# Patient Record
Sex: Female | Born: 1970 | Race: White | Hispanic: No | Smoking: Never smoker
Health system: Southern US, Community
[De-identification: ages and names within clinical notes are randomized; demographics above are authoritative.]

## PROBLEM LIST (undated history)

## (undated) DIAGNOSIS — N920 Excessive and frequent menstruation with regular cycle: Secondary | ICD-10-CM

## (undated) DIAGNOSIS — E669 Obesity, unspecified: Secondary | ICD-10-CM

## (undated) DIAGNOSIS — E785 Hyperlipidemia, unspecified: Secondary | ICD-10-CM

## (undated) HISTORY — DX: Obesity, unspecified: E66.9

## (undated) HISTORY — DX: Excessive and frequent menstruation with regular cycle: N92.0

## (undated) HISTORY — DX: Hyperlipidemia, unspecified: E78.5

---

## 1999-08-16 ENCOUNTER — Other Ambulatory Visit: Admission: RE | Admit: 1999-08-16 | Discharge: 1999-08-16 | Payer: Self-pay | Admitting: Gynecology

## 2000-07-31 ENCOUNTER — Other Ambulatory Visit: Admission: RE | Admit: 2000-07-31 | Discharge: 2000-07-31 | Payer: Self-pay | Admitting: *Deleted

## 2000-11-10 ENCOUNTER — Encounter: Admission: RE | Admit: 2000-11-10 | Discharge: 2001-02-08 | Payer: Self-pay | Admitting: Gynecology

## 2001-02-12 ENCOUNTER — Encounter (INDEPENDENT_AMBULATORY_CARE_PROVIDER_SITE_OTHER): Payer: Self-pay | Admitting: *Deleted

## 2001-02-12 ENCOUNTER — Inpatient Hospital Stay (HOSPITAL_COMMUNITY): Admission: AD | Admit: 2001-02-12 | Discharge: 2001-02-14 | Payer: Self-pay | Admitting: *Deleted

## 2001-03-23 ENCOUNTER — Other Ambulatory Visit: Admission: RE | Admit: 2001-03-23 | Discharge: 2001-03-23 | Payer: Self-pay | Admitting: Gynecology

## 2002-04-12 ENCOUNTER — Other Ambulatory Visit: Admission: RE | Admit: 2002-04-12 | Discharge: 2002-04-12 | Payer: Self-pay | Admitting: Gynecology

## 2005-12-01 ENCOUNTER — Other Ambulatory Visit: Admission: RE | Admit: 2005-12-01 | Discharge: 2005-12-01 | Payer: Self-pay | Admitting: Family Medicine

## 2008-02-01 ENCOUNTER — Other Ambulatory Visit: Admission: RE | Admit: 2008-02-01 | Discharge: 2008-02-01 | Payer: Self-pay | Admitting: Family Medicine

## 2010-02-14 ENCOUNTER — Other Ambulatory Visit: Admission: RE | Admit: 2010-02-14 | Discharge: 2010-02-14 | Payer: Self-pay | Admitting: Family Medicine

## 2011-04-11 NOTE — H&P (Signed)
[facility]  Patient:    Christy Kidd, Christy Kidd                      MRN: [id number] Adm. Date:  [id number] Attending:  Wetzel Bjornstad                         History and Physical  CHIEF COMPLAINT:              Labor.  HISTORY OF PRESENT ILLNESS:   A 40 year old G67, P45, female at [redacted] weeks gestation.  History of chronic hypertension with registration blood pressure 140/90, although noting most recent prenatal visits with blood pressures in the 110-120/70 range as well as diet-controlled gestational diabetes with well controlled glucose on diet alone, who presents to the office for routine visit and antepartum testing.  The patient notes she has been contracting since 6:00 this morning.  These seem to be progressively getting stronger and was found to be contracting every 3-5 minutes on external monitors.  She had a reactive fetal tracing.  The patients pelvic exam was 3-4 cm dilated, 80% effaced, -2 to -1 station, with her exam last week showing 2 cm dilated, long, and high. The patient is admitted at this time with early labor.  PRENATAL COURSE:              Complicated by chronic hypertension and gestational diabetes, as noted above.  The patient is beta strep negative.  PAST MEDICAL HISTORY:         None.  PAST SURGICAL HISTORY:        Elbow surgery.  ALLERGIES:                    None.  MEDICATIONS:                  Vitamins.  REVIEW OF SYSTEMS:            Noncontributory.  FAMILY HISTORY:               Noncontributory.  ADMISSION PHYSICAL EXAMINATION:  VITAL SIGNS:                  Afebrile, vital signs are stable.  HEENT:                        Normal.  LUNGS:                        Clear.  CARDIAC:                      Regular rate.  No murmur, rub or gallop.  ABDOMEN:                      Gravid, vertex fetus, reactive fetal tracing, contractions every 3-5 minutes.  PELVIC:                       3-4 cm, 80%, -1 to -2  station.  ASSESSMENT:                   A 40 year old G2, P39, female, history of chronic hypertension with normal blood pressures now, history of gestational diabetes with diet control.  Good glucose values, admitted in early labor at 3-4 cm dilatation, contracting every 3-5 minutes.  The patient is  beta strep negative, and she is admitted for labor and delivery. DD:  02/12/01 TD:  02/12/01 Job: [id number]

## 2011-04-11 NOTE — Discharge Summary (Signed)
[facility]  Patient:    Christy Kidd, Christy Kidd                      MRN: [id number] Adm. Date:  [id number] Disc. Date: [id number] Attending:  Wetzel Bjornstad Dictator:   Antony Contras, [facility]                           Discharge Summary  DISCHARGE DIAGNOSES:          1. Intrauterine pregnancy at term.                               2. History of chronic hypertension.                               3. Gestational diabetes, diet-controlled.  PROCEDURES:                   1. Normal spontaneous vaginal delivery of                                  viable infant over intact perineum.                               2. Reduction of nuchal cord.  HISTORY OF PRESENT ILLNESS:   The patient is a 40 year old gravida 2, para 1-0-0-1, with an LMP of May 15, 2000, [facility] of February 17, 2001.  Prenatal risk factors include a history of chronic hypertension and gestational diabetes controlled by diet.  PRENATAL LABORATORY DATA:     Blood type OB positive, antibody screen negative.  RPR, HBsAg, HIV nonreactive.  Rubella immune.  MSAFP normal.  GBS negative.  HOSPITAL COURSE:              The patient presented on February 12, 2001, in active labor.  Cervix on admission was 5-6 cm, completely effaced, -1 station. She progressed to complete dilatation.  Delivery was performed by Dr. Lily Peer.  She delivered an Apgars 8 and 39 female infant weighing 8 pounds over an intact perineum.  There was a nuchal cord x 1 which was manually reduced.  Placenta was intact.  Postpartum course:  The patient remained afebrile, had no difficulty voiding, was able to be discharged in satisfactory condition on her second postpartum day.  CBC:  Hematocrit 37.7, hemoglobin 13.6, platelets 259.  FOLLOW-UP:                    In six weeks.  MEDICATIONS:                  Continue with prenatal vitamins and iron. Motrin and Tylox for pain. DD:  03/01/01 TD:  03/02/01 Job: [id number]

## 2014-04-26 ENCOUNTER — Other Ambulatory Visit: Payer: Self-pay | Admitting: Family Medicine

## 2014-04-26 ENCOUNTER — Other Ambulatory Visit (HOSPITAL_COMMUNITY)
Admission: RE | Admit: 2014-04-26 | Discharge: 2014-04-26 | Disposition: A | Discharge status: Home / Self Care | Payer: BC Managed Care – PPO | Source: Ambulatory Visit | Attending: Family Medicine | Admitting: Family Medicine

## 2014-04-26 ENCOUNTER — Other Ambulatory Visit: Payer: Self-pay

## 2014-04-26 DIAGNOSIS — Z1151 Encounter for screening for human papillomavirus (HPV): Secondary | ICD-10-CM | POA: Insufficient documentation

## 2014-04-26 DIAGNOSIS — Z124 Encounter for screening for malignant neoplasm of cervix: Secondary | ICD-10-CM | POA: Insufficient documentation

## 2014-04-26 DIAGNOSIS — Z1231 Encounter for screening mammogram for malignant neoplasm of breast: Secondary | ICD-10-CM

## 2014-04-28 LAB — CYTOLOGY - PAP

## 2014-05-05 ENCOUNTER — Ambulatory Visit
Admission: RE | Admit: 2014-05-05 | Discharge: 2014-05-05 | Disposition: A | Discharge status: Home / Self Care | Payer: BC Managed Care – PPO | Source: Ambulatory Visit

## 2014-05-05 ENCOUNTER — Encounter (INDEPENDENT_AMBULATORY_CARE_PROVIDER_SITE_OTHER): Payer: Self-pay

## 2014-05-05 DIAGNOSIS — Z1231 Encounter for screening mammogram for malignant neoplasm of breast: Secondary | ICD-10-CM

## 2014-05-24 ENCOUNTER — Other Ambulatory Visit: Payer: Self-pay | Admitting: Family Medicine

## 2014-05-24 DIAGNOSIS — R928 Other abnormal and inconclusive findings on diagnostic imaging of breast: Secondary | ICD-10-CM

## 2014-06-05 ENCOUNTER — Encounter (INDEPENDENT_AMBULATORY_CARE_PROVIDER_SITE_OTHER): Payer: Self-pay

## 2014-06-05 ENCOUNTER — Ambulatory Visit
Admission: RE | Admit: 2014-06-05 | Discharge: 2014-06-05 | Disposition: A | Discharge status: Home / Self Care | Payer: BC Managed Care – PPO | Source: Ambulatory Visit | Attending: Family Medicine | Admitting: Family Medicine

## 2014-06-05 DIAGNOSIS — R928 Other abnormal and inconclusive findings on diagnostic imaging of breast: Secondary | ICD-10-CM

## 2016-07-18 ENCOUNTER — Other Ambulatory Visit: Payer: Self-pay | Admitting: Family Medicine

## 2016-07-18 DIAGNOSIS — Z1231 Encounter for screening mammogram for malignant neoplasm of breast: Secondary | ICD-10-CM

## 2016-07-23 ENCOUNTER — Ambulatory Visit
Admission: RE | Admit: 2016-07-23 | Discharge: 2016-07-23 | Disposition: A | Discharge status: Home / Self Care | Payer: BC Managed Care – PPO | Source: Ambulatory Visit | Attending: Family Medicine | Admitting: Family Medicine

## 2016-07-23 DIAGNOSIS — Z1231 Encounter for screening mammogram for malignant neoplasm of breast: Secondary | ICD-10-CM

## 2016-07-29 ENCOUNTER — Other Ambulatory Visit: Payer: Self-pay | Admitting: Internal Medicine

## 2016-07-29 ENCOUNTER — Other Ambulatory Visit: Payer: Self-pay | Admitting: Family Medicine

## 2016-07-29 DIAGNOSIS — R928 Other abnormal and inconclusive findings on diagnostic imaging of breast: Secondary | ICD-10-CM

## 2016-08-08 ENCOUNTER — Ambulatory Visit
Admission: RE | Admit: 2016-08-08 | Discharge: 2016-08-08 | Disposition: A | Discharge status: Home / Self Care | Payer: BC Managed Care – PPO | Source: Ambulatory Visit | Attending: Family Medicine | Admitting: Family Medicine

## 2016-08-08 DIAGNOSIS — R928 Other abnormal and inconclusive findings on diagnostic imaging of breast: Secondary | ICD-10-CM

## 2016-12-31 ENCOUNTER — Other Ambulatory Visit: Payer: Self-pay | Admitting: Family Medicine

## 2016-12-31 DIAGNOSIS — R921 Mammographic calcification found on diagnostic imaging of breast: Secondary | ICD-10-CM

## 2017-01-28 ENCOUNTER — Ambulatory Visit
Admission: RE | Admit: 2017-01-28 | Discharge: 2017-01-28 | Disposition: A | Discharge status: Home / Self Care | Payer: BC Managed Care – PPO | Source: Ambulatory Visit | Attending: Family Medicine | Admitting: Family Medicine

## 2017-01-28 DIAGNOSIS — R921 Mammographic calcification found on diagnostic imaging of breast: Secondary | ICD-10-CM

## 2017-06-25 ENCOUNTER — Other Ambulatory Visit: Payer: Self-pay | Admitting: Family Medicine

## 2017-06-25 DIAGNOSIS — R921 Mammographic calcification found on diagnostic imaging of breast: Secondary | ICD-10-CM

## 2017-08-14 ENCOUNTER — Other Ambulatory Visit: Payer: Self-pay | Admitting: Family Medicine

## 2017-08-14 ENCOUNTER — Ambulatory Visit
Admission: RE | Admit: 2017-08-14 | Discharge: 2017-08-14 | Disposition: A | Discharge status: Home / Self Care | Payer: BC Managed Care – PPO | Source: Ambulatory Visit | Attending: Family Medicine | Admitting: Family Medicine

## 2017-08-14 DIAGNOSIS — R921 Mammographic calcification found on diagnostic imaging of breast: Secondary | ICD-10-CM

## 2019-05-20 ENCOUNTER — Other Ambulatory Visit: Payer: Self-pay | Admitting: Family Medicine

## 2019-05-20 ENCOUNTER — Other Ambulatory Visit (HOSPITAL_COMMUNITY)
Admission: RE | Admit: 2019-05-20 | Discharge: 2019-05-20 | Disposition: A | Discharge status: Home / Self Care | Payer: BC Managed Care – PPO | Source: Ambulatory Visit | Attending: Family Medicine | Admitting: Family Medicine

## 2019-05-20 DIAGNOSIS — Z124 Encounter for screening for malignant neoplasm of cervix: Secondary | ICD-10-CM | POA: Insufficient documentation

## 2019-05-25 LAB — CYTOLOGY - PAP
Diagnosis: NEGATIVE
HPV: NOT DETECTED

## 2020-02-03 ENCOUNTER — Ambulatory Visit: Payer: BC Managed Care – PPO | Attending: Internal Medicine

## 2020-02-03 DIAGNOSIS — Z23 Encounter for immunization: Secondary | ICD-10-CM

## 2020-02-03 NOTE — Progress Notes (Signed)
   Covid-19 Vaccination Clinic  Name:  Christy Kidd    MRN: [id number] DOB: December 01, 1970  02/03/2020  Christy Kidd was observed post Covid-19 immunization for 15 minutes without incident. She was provided with Vaccine Information Sheet and instruction to access the V-Safe system.   Christy Kidd was instructed to call 911 with any severe reactions post vaccine: Marland Kitchen Difficulty breathing  . Swelling of face and throat  . A fast heartbeat  . A bad rash all over body  . Dizziness and weakness   Immunizations Administered    Name Date Dose VIS Date Route   Pfizer COVID-19 Vaccine 02/03/2020 12:57 PM 0.3 mL 11/04/2019 Intramuscular   Manufacturer: [employer], [employer]   Lot: [id number]   NDC: [address]-3

## 2020-02-29 ENCOUNTER — Ambulatory Visit: Payer: BC Managed Care – PPO | Attending: Internal Medicine

## 2020-02-29 DIAGNOSIS — Z23 Encounter for immunization: Secondary | ICD-10-CM

## 2020-02-29 NOTE — Progress Notes (Signed)
   Covid-19 Vaccination Clinic  Name:  PABLO MATHURIN    MRN: [id number] DOB: 1971-02-19  02/29/2020  Ms. Wardrop was observed post Covid-19 immunization for 15 minutes without incident. She was provided with Vaccine Information Sheet and instruction to access the V-Safe system.   Ms. Salmons was instructed to call 911 with any severe reactions post vaccine: Marland Kitchen Difficulty breathing  . Swelling of face and throat  . A fast heartbeat  . A bad rash all over body  . Dizziness and weakness   Immunizations Administered    Name Date Dose VIS Date Route   Pfizer COVID-19 Vaccine 02/29/2020  8:25 AM 0.3 mL 11/04/2019 Intramuscular   Manufacturer: [employer], [employer]   Lot: [id number]   NDC: [address]-7

## 2020-04-30 ENCOUNTER — Other Ambulatory Visit: Payer: Self-pay | Admitting: Family Medicine

## 2020-04-30 DIAGNOSIS — R921 Mammographic calcification found on diagnostic imaging of breast: Secondary | ICD-10-CM

## 2020-05-09 ENCOUNTER — Ambulatory Visit
Admission: RE | Admit: 2020-05-09 | Discharge: 2020-05-09 | Disposition: A | Discharge status: Home / Self Care | Payer: BC Managed Care – PPO | Source: Ambulatory Visit | Attending: Family Medicine | Admitting: Family Medicine

## 2020-05-09 ENCOUNTER — Other Ambulatory Visit: Payer: Self-pay

## 2020-05-09 DIAGNOSIS — R921 Mammographic calcification found on diagnostic imaging of breast: Secondary | ICD-10-CM

## 2020-07-03 ENCOUNTER — Other Ambulatory Visit: Payer: Self-pay

## 2020-07-03 ENCOUNTER — Emergency Department (HOSPITAL_BASED_OUTPATIENT_CLINIC_OR_DEPARTMENT_OTHER)
Admission: EM | Admit: 2020-07-03 | Discharge: 2020-07-03 | Discharge status: Against Medical Advice | Payer: BC Managed Care – PPO

## 2020-07-03 NOTE — ED Notes (Signed)
Pt chose to go to [facility]

## 2020-07-04 ENCOUNTER — Ambulatory Visit (HOSPITAL_COMMUNITY)
Admission: RE | Admit: 2020-07-04 | Discharge: 2020-07-04 | Disposition: A | Discharge status: Home / Self Care | Payer: BC Managed Care – PPO | Source: Ambulatory Visit | Attending: Vascular Surgery | Admitting: Vascular Surgery

## 2020-07-04 ENCOUNTER — Other Ambulatory Visit (HOSPITAL_COMMUNITY): Payer: Self-pay | Admitting: Family Medicine

## 2020-07-04 DIAGNOSIS — R7989 Other specified abnormal findings of blood chemistry: Secondary | ICD-10-CM | POA: Diagnosis not present

## 2020-07-04 NOTE — Progress Notes (Signed)
Left lower venous duplex completed.  Called results to [facility] at Dr. Rondel Baton office. Patient can be instructed to leave and Melissa with call patient with instructions after speaking to MD.  Jeb Levering, BS, RDMS, RVT

## 2020-07-13 ENCOUNTER — Telehealth: Payer: Self-pay | Admitting: Hematology and Oncology

## 2020-07-13 NOTE — Telephone Encounter (Signed)
Received a new hem referral from Sigmund Hazel at [place] for management of dvt. Ms. Christy Kidd has been cld and scheduled to see Dr. Leonides Schanz on 9/10 at 2pm w/labs at 3pm. Pt aware to arrive 15 minutes early. Letter mailed.

## 2020-08-03 ENCOUNTER — Other Ambulatory Visit: Payer: Self-pay

## 2020-08-03 ENCOUNTER — Inpatient Hospital Stay: Payer: BC Managed Care – PPO

## 2020-08-03 ENCOUNTER — Inpatient Hospital Stay: Payer: BC Managed Care – PPO | Attending: Hematology and Oncology | Admitting: Hematology and Oncology

## 2020-08-03 ENCOUNTER — Encounter: Payer: Self-pay | Admitting: Hematology and Oncology

## 2020-08-03 VITALS — BP 160/81 | HR 117 | Temp 99.6°F | Resp 18 | Ht 64.0 in | Wt 220.7 lb

## 2020-08-03 DIAGNOSIS — E669 Obesity, unspecified: Secondary | ICD-10-CM | POA: Diagnosis not present

## 2020-08-03 DIAGNOSIS — Z6837 Body mass index (BMI) 37.0-37.9, adult: Secondary | ICD-10-CM | POA: Diagnosis not present

## 2020-08-03 DIAGNOSIS — N920 Excessive and frequent menstruation with regular cycle: Secondary | ICD-10-CM | POA: Diagnosis not present

## 2020-08-03 DIAGNOSIS — Z7901 Long term (current) use of anticoagulants: Secondary | ICD-10-CM | POA: Diagnosis not present

## 2020-08-03 DIAGNOSIS — Z79899 Other long term (current) drug therapy: Secondary | ICD-10-CM | POA: Insufficient documentation

## 2020-08-03 DIAGNOSIS — I82452 Acute embolism and thrombosis of left peroneal vein: Secondary | ICD-10-CM

## 2020-08-03 DIAGNOSIS — E785 Hyperlipidemia, unspecified: Secondary | ICD-10-CM | POA: Diagnosis not present

## 2020-08-03 DIAGNOSIS — I82442 Acute embolism and thrombosis of left tibial vein: Secondary | ICD-10-CM | POA: Insufficient documentation

## 2020-08-03 LAB — CMP (CANCER CENTER ONLY)
ALT: 14 U/L (ref 0–44)
AST: 16 U/L (ref 15–41)
Albumin: 4.1 g/dL (ref 3.5–5.0)
Alkaline Phosphatase: 35 U/L — ABNORMAL LOW (ref 38–126)
Anion gap: 10 (ref 5–15)
BUN: 14 mg/dL (ref 6–20)
CO2: 24 mmol/L (ref 22–32)
Calcium: 10.1 mg/dL (ref 8.9–10.3)
Chloride: 107 mmol/L (ref 98–111)
Creatinine: 0.75 mg/dL (ref 0.44–1.00)
GFR, Est AFR Am: >60 mL/min (ref >60)
GFR, Estimated: >60 mL/min (ref >60)
Glucose, Bld: 83 mg/dL (ref 70–99)
Potassium: 4.2 mmol/L (ref 3.5–5.1)
Sodium: 141 mmol/L (ref 135–145)
Total Bilirubin: 0.2 mg/dL — ABNORMAL LOW (ref 0.3–1.2)
Total Protein: 7.6 g/dL (ref 6.5–8.1)

## 2020-08-03 LAB — CBC WITH DIFFERENTIAL (CANCER CENTER ONLY)
Abs Immature Granulocytes: 0.03 10*3/uL (ref 0.00–0.07)
Basophils Absolute: 0 10*3/uL (ref 0.0–0.1)
Basophils Relative: 1 %
Eosinophils Absolute: 0.2 10*3/uL (ref 0.0–0.5)
Eosinophils Relative: 3 %
HCT: 34.2 % — ABNORMAL LOW (ref 36.0–46.0)
Hemoglobin: 11.2 g/dL — ABNORMAL LOW (ref 12.0–15.0)
Immature Granulocytes: 1 %
Lymphocytes Relative: 32 %
Lymphs Abs: 2 10*3/uL (ref 0.7–4.0)
MCH: 30.3 pg (ref 26.0–34.0)
MCHC: 32.7 g/dL (ref 30.0–36.0)
MCV: 92.4 fL (ref 80.0–100.0)
Monocytes Absolute: 0.6 10*3/uL (ref 0.1–1.0)
Monocytes Relative: 10 %
Neutro Abs: 3.4 10*3/uL (ref 1.7–7.7)
Neutrophils Relative %: 53 %
Platelet Count: 532 10*3/uL — ABNORMAL HIGH (ref 150–400)
RBC: 3.7 MIL/uL — ABNORMAL LOW (ref 3.87–5.11)
RDW: 13.1 % (ref 11.5–15.5)
WBC Count: 6.2 10*3/uL (ref 4.0–10.5)
nRBC: 0 % (ref 0.0–0.2)

## 2020-08-03 NOTE — Progress Notes (Signed)
[facility] [phone]   [phone]  INITIAL CONSULT NOTE  Patient Care Team: Sigmund Hazel, MD as PCP - General (Family Medicine)  Hematological/Oncological History #Provoked Left Lower Extremity DVT  07/04/2020: [place] LLE showed findings consistent with acute deep vein thrombosis involving the left posterior tibial veins, and left peroneal veins. Started on Xarelto 15 mg PO BID x 21 days, transition to Xarelto 20mg  PO daily. D-dimer 1.49 mg/L 08/03/2020: establish care with Dr. 10/03/2020   CHIEF COMPLAINTS/PURPOSE OF CONSULTATION:  " Left Lower Extremity DVT "  HISTORY OF PRESENTING ILLNESS:  Christy Kidd 49 y.o. female with medical history significant for obesity (BMI 37), HLD, and menorrhagia who presents for evaluation of a LLE DVT.   On review of the previous records Christy Kidd presented for a ultrasound of her left lower extremity when she developed pain and swelling following a prolonged period of immobility due to hornet stings.  Ultrasound showed an acute deep vein thrombosis involving the left posterior tibial veins and left peroneal veins.  She was started on Xarelto 50 mg p.o. twice daily x21 days with a plan to transition to Xarelto 20 mg.  Her D-dimer at that time was 1.49 mg/L.  Due to concern for this new diagnosis of DVT the patient was referred to hematology for further evaluation and management.  On exam today Christy Kidd notes that her story begins when she was attacked by a group of hornets while mowing her lawn.  She notes that she got stings on her inner thighs bilaterally.  Due to the pain and swelling that she developed in her thighs she had a.  Of several days of prolonged immobility where she reports that she sat for approximately 20 hours/day with her legs separated from each other.  She notes that when the rash began to resolve from the hornet stings she noticed that her left lower extremity was swelling, had erythema, and  continued to be painful.  When this persisted she sought medical attention.  She went to numerous emergency departments but was told that the weight would be all the way until the next morning.  She eventually received an outpatient ultrasound which showed her DVTs.  On further discussion she notes that she has a history of menorrhagia and is currently still having menstrual bleeding.  She reports that she does take an estrogen-based oral contraceptive pill for this purpose.  She is tolerating the Xarelto medication she was prescribed well without any issues of bleeding, bruising, or dark stools.  She has discontinued the oral contraceptive pill since her diagnosis.  Otherwise denies having any issues with chest pain, shortness of breath, fevers, chills, sweats, nausea, vomiting or diarrhea.  A full 10 point ROS is listed below.  MEDICAL HISTORY:  Past Medical History:  Diagnosis Date  . HLD (hyperlipidemia)   . Menorrhagia   . Obesity     SURGICAL HISTORY: History reviewed. No pertinent surgical history.  SOCIAL HISTORY: Social History   Socioeconomic History  . Marital status: Divorced    Spouse name: Not on file  . Number of children: 2  . Years of education: Not on file  . Highest education level: Not on file  Occupational History  . Not on file  Tobacco Use  . Smoking status: Never Smoker  . Smokeless tobacco: Never Used  Substance and Sexual Activity  . Alcohol use: Not Currently  . Drug use: Not on file  . Sexual activity: Not on file  Other Topics Concern  . Not on file  Social History Narrative  . Not on file   Social Determinants of Health   Financial Resource Strain:   . Difficulty of Paying Living Expenses: Not on file  Food Insecurity:   . Worried About Programme researcher, broadcasting/film/video in the Last Year: Not on file  . Ran Out of Food in the Last Year: Not on file  Transportation Needs:   . Lack of Transportation (Medical): Not on file  . Lack of Transportation  (Non-Medical): Not on file  Physical Activity:   . Days of Exercise per Week: Not on file  . Minutes of Exercise per Session: Not on file  Stress:   . Feeling of Stress : Not on file  Social Connections:   . Frequency of Communication with Friends and Family: Not on file  . Frequency of Social Gatherings with Friends and Family: Not on file  . Attends Religious Services: Not on file  . Active Member of Clubs or Organizations: Not on file  . Attends Banker Meetings: Not on file  . Marital Status: Not on file  Intimate Partner Violence:   . Fear of Current or Ex-Partner: Not on file  . Emotionally Abused: Not on file  . Physically Abused: Not on file  . Sexually Abused: Not on file    FAMILY HISTORY: Family History  Problem Relation Age of Onset  . Leukemia Mother   . Healthy Father   . Varicose Veins Paternal Grandmother     ALLERGIES:  has No Known Allergies.  MEDICATIONS:  Current Outpatient Medications  Medication Sig Dispense Refill  . B Complex-C (B-COMPLEX WITH VITAMIN C) tablet     . Calcium-Vitamin D-Vitamin K (VIACTIV CALCIUM PLUS D) 650-12.5-40 MG-MCG-MCG CHEW     . ergocalciferol (VITAMIN D2) 1.25 MG (50000 UT) capsule     . Ferrous Sulfate (PX IRON) 27 MG TABS     . Fluticasone Propionate (FLONASE ALLERGY RELIEF NA)     . Loratadine 10 MG CAPS     . Multiple Vitamin (DAILY VITAMIN FORMULA PO)     . atorvastatin (LIPITOR) 10 MG tablet Take 10 mg by mouth daily.    . fenofibrate micronized (LOFIBRA) 200 MG capsule Take 200 mg by mouth daily.    Marland Kitchen spironolactone (ALDACTONE) 50 MG tablet Take 50 mg by mouth daily.    Carlena Hurl 20 MG TABS tablet Take 20 mg by mouth at bedtime.     No current facility-administered medications for this visit.    REVIEW OF SYSTEMS:   Constitutional: ( - ) fevers, ( - )  chills , ( - ) night sweats Eyes: ( - ) blurriness of vision, ( - ) double vision, ( - ) watery eyes Ears, nose, mouth, throat, and face: ( - )  mucositis, ( - ) sore throat Respiratory: ( - ) cough, ( - ) dyspnea, ( - ) wheezes Cardiovascular: ( - ) palpitation, ( - ) chest discomfort, ( - ) lower extremity swelling Gastrointestinal:  ( - ) nausea, ( - ) heartburn, ( - ) change in bowel habits Skin: ( - ) abnormal skin rashes Lymphatics: ( - ) new lymphadenopathy, ( - ) easy bruising Neurological: ( - ) numbness, ( - ) tingling, ( - ) new weaknesses Behavioral/Psych: ( - ) mood change, ( - ) new changes  All other systems were reviewed with the patient and are negative.  PHYSICAL EXAMINATION: ECOG PERFORMANCE STATUS: 1 -  Symptomatic but completely ambulatory  Vitals:   08/03/20 1352  BP: (!) 160/81  Pulse: (!) 117  Resp: 18  Temp: 99.6 F (37.6 C)  SpO2: 98%   Filed Weights   08/03/20 1352  Weight: 220 lb 11.2 oz (100.1 kg)    GENERAL: well appearing middle aged Caucasian female in NAD  SKIN: skin color, texture, turgor are normal, no rashes or significant lesions EYES: conjunctiva are pink and non-injected, sclera clear LUNGS: clear to auscultation and percussion with normal breathing effort HEART: regular rate & rhythm and no murmurs and no lower extremity edema Musculoskeletal: no cyanosis of digits and no clubbing  PSYCH: alert & oriented x 3, fluent speech NEURO: no focal motor/sensory deficits  LABORATORY DATA:  I have reviewed the data as listed CBC Latest Ref Rng & Units 08/03/2020  WBC 4.0 - 10.5 K/uL 6.2  Hemoglobin 12.0 - 15.0 g/dL 11.2(L)  Hematocrit 36 - 46 % 34.2(L)  Platelets 150 - 400 K/uL 532(H)    CMP Latest Ref Rng & Units 08/03/2020  Glucose 70 - 99 mg/dL 83  BUN 6 - 20 mg/dL 14  Creatinine 1.320.44 - 4.401.00 mg/dL 1.020.75  Sodium 725135 - 366145 mmol/L 141  Potassium 3.5 - 5.1 mmol/L 4.2  Chloride 98 - 111 mmol/L 107  CO2 22 - 32 mmol/L 24  Calcium 8.9 - 10.3 mg/dL 44.010.1  Total Protein 6.5 - 8.1 g/dL 7.6  Total Bilirubin 0.3 - 1.2 mg/dL 3.4(V0.2(L)  Alkaline Phos 38 - 126 U/L 35(L)  AST 15 - 41 U/L 16   ALT 0 - 44 U/L 14    RADIOGRAPHIC STUDIES: I have personally reviewed the radiological images as listed and agreed with the findings in the report. No results found.  ASSESSMENT & PLAN Christy Kidd 49 y.o. female with medical history significant for obesity (BMI 37), HLD, and menorrhagia who presents for evaluation of a LLE DVT.  To review the labs, review the records, discussion with the patient the findings are most consistent with a provoked left lower extremity DVT due to prolonged immobility following hornet stings and estrogen-based oral contraceptive pills.  As a result I would recommend anticoagulation for duration of 3 to 6 months.  The termination between 3 to 6 months is based upon symptoms at 5166-month mark.  As such we will have the patient return in approximately 2 months time in order to further evaluate and determine if extended anticoagulation is required.  In the interim I would recommend consideration of transition to a progestin based oral contraceptive as this would reduce her risk in the future compared to estrogen-based oral contraceptives.  # Provoked Left Lower Extremity DVT  -- given the patient's account of her DVT the findings are most consistent with a provoked DVT due to immobility and estrogen based OCPs --recommend continuation of Xarelto 20mg  for 3-6 months. In order to determine duration, will have the patient return in approximately 2 months to see if an additional 3 months is required --strict return precautions for chest pain, shortness of breath, or new/worsening leg pain/swelling --RTC in 2 months time.   #Menorrhagia --currently under the management of Ob/GYN --Recommend progestin based birth control, if feasible -- caution with excessive bleeding on Xarelto.   Orders Placed This Encounter  Procedures  . CBC with Differential (Cancer Center Only)    Standing Status:   Future    Number of Occurrences:   1    Standing Expiration Date:   08/03/2021  .  CMP (Cancer Center only)    Standing Status:   Future    Number of Occurrences:   1    Standing Expiration Date:   08/03/2021   All questions were answered. The patient knows to call the clinic with any problems, questions or concerns.  A total of more than 60 minutes were spent on this encounter and over half of that time was spent on counseling and coordination of care as outlined above.   Ulysees Barns, MD Department of Hematology/Oncology [facility] Phone: [phone] Pager: [phone] Email: [email or web address]  08/04/2020 12:13 PM

## 2020-08-04 ENCOUNTER — Encounter: Payer: Self-pay | Admitting: Hematology and Oncology

## 2020-08-06 ENCOUNTER — Telehealth: Payer: Self-pay | Admitting: Hematology and Oncology

## 2020-08-06 NOTE — Telephone Encounter (Signed)
Scheduled per los. Called and spoke with patient. Confirmed appt 

## 2020-08-07 ENCOUNTER — Telehealth: Payer: Self-pay | Admitting: *Deleted

## 2020-08-07 NOTE — Telephone Encounter (Signed)
-----   Message from Jaci Standard, MD sent at 08/06/2020  9:53 AM EDT ----- Please let Christy Kidd know that her labs look good overall. She has a mild anemia and increased platelet count consistent with iron deficiency anemia. She has been prescribed PO iron therapy. Please assure she is taking this. We will see her back in about 2 months time.  ----- Message ----- From: Leory Plowman, [facility] [place] Sent: 08/03/2020   3:00 PM EDT To: Jaci Standard, MD

## 2020-08-07 NOTE — Telephone Encounter (Signed)
TCT patient regarding lab results from 08/03/20. No answer but was able to leave detailed message on her identified vm . Advised that her labs are consistent with iron deficiency anemia. Advised to continue her oral iron supplements. Advised of her f/u appt on 10/05/20. Advised to call back with any questions or concerns to [phone].

## 2020-10-05 ENCOUNTER — Other Ambulatory Visit: Payer: Self-pay | Admitting: Hematology and Oncology

## 2020-10-05 ENCOUNTER — Inpatient Hospital Stay: Payer: BC Managed Care – PPO | Attending: Hematology and Oncology | Admitting: Hematology and Oncology

## 2020-10-05 ENCOUNTER — Other Ambulatory Visit: Payer: Self-pay

## 2020-10-05 ENCOUNTER — Inpatient Hospital Stay: Payer: BC Managed Care – PPO

## 2020-10-05 VITALS — BP 168/80 | HR 102 | Temp 99.2°F | Resp 18 | Ht 64.0 in | Wt 222.3 lb

## 2020-10-05 DIAGNOSIS — D649 Anemia, unspecified: Secondary | ICD-10-CM | POA: Insufficient documentation

## 2020-10-05 DIAGNOSIS — I82452 Acute embolism and thrombosis of left peroneal vein: Secondary | ICD-10-CM

## 2020-10-05 DIAGNOSIS — D75839 Thrombocytosis, unspecified: Secondary | ICD-10-CM | POA: Diagnosis not present

## 2020-10-05 DIAGNOSIS — E785 Hyperlipidemia, unspecified: Secondary | ICD-10-CM | POA: Insufficient documentation

## 2020-10-05 DIAGNOSIS — Z79899 Other long term (current) drug therapy: Secondary | ICD-10-CM | POA: Diagnosis not present

## 2020-10-05 DIAGNOSIS — I82402 Acute embolism and thrombosis of unspecified deep veins of left lower extremity: Secondary | ICD-10-CM | POA: Diagnosis not present

## 2020-10-05 DIAGNOSIS — Z7901 Long term (current) use of anticoagulants: Secondary | ICD-10-CM | POA: Insufficient documentation

## 2020-10-05 DIAGNOSIS — I82442 Acute embolism and thrombosis of left tibial vein: Secondary | ICD-10-CM | POA: Diagnosis present

## 2020-10-05 LAB — CBC WITH DIFFERENTIAL (CANCER CENTER ONLY)
Abs Immature Granulocytes: 0.01 10*3/uL (ref 0.00–0.07)
Basophils Absolute: 0 10*3/uL (ref 0.0–0.1)
Basophils Relative: 1 %
Eosinophils Absolute: 0.2 10*3/uL (ref 0.0–0.5)
Eosinophils Relative: 3 %
HCT: 33.8 % — ABNORMAL LOW (ref 36.0–46.0)
Hemoglobin: 11.2 g/dL — ABNORMAL LOW (ref 12.0–15.0)
Immature Granulocytes: 0 %
Lymphocytes Relative: 38 %
Lymphs Abs: 2.1 10*3/uL (ref 0.7–4.0)
MCH: 30.4 pg (ref 26.0–34.0)
MCHC: 33.1 g/dL (ref 30.0–36.0)
MCV: 91.6 fL (ref 80.0–100.0)
Monocytes Absolute: 0.6 10*3/uL (ref 0.1–1.0)
Monocytes Relative: 11 %
Neutro Abs: 2.6 10*3/uL (ref 1.7–7.7)
Neutrophils Relative %: 47 %
Platelet Count: 431 10*3/uL — ABNORMAL HIGH (ref 150–400)
RBC: 3.69 MIL/uL — ABNORMAL LOW (ref 3.87–5.11)
RDW: 12.8 % (ref 11.5–15.5)
WBC Count: 5.5 10*3/uL (ref 4.0–10.5)
nRBC: 0 % (ref 0.0–0.2)

## 2020-10-05 LAB — CMP (CANCER CENTER ONLY)
ALT: 25 U/L (ref 0–44)
AST: 22 U/L (ref 15–41)
Albumin: 4 g/dL (ref 3.5–5.0)
Alkaline Phosphatase: 32 U/L — ABNORMAL LOW (ref 38–126)
Anion gap: 8 (ref 5–15)
BUN: 16 mg/dL (ref 6–20)
CO2: 28 mmol/L (ref 22–32)
Calcium: 9.4 mg/dL (ref 8.9–10.3)
Chloride: 106 mmol/L (ref 98–111)
Creatinine: 0.86 mg/dL (ref 0.44–1.00)
GFR, Estimated: >60 mL/min (ref >60)
Glucose, Bld: 128 mg/dL — ABNORMAL HIGH (ref 70–99)
Potassium: 4.2 mmol/L (ref 3.5–5.1)
Sodium: 142 mmol/L (ref 135–145)
Total Bilirubin: 0.3 mg/dL (ref 0.3–1.2)
Total Protein: 7.1 g/dL (ref 6.5–8.1)

## 2020-10-06 ENCOUNTER — Encounter: Payer: Self-pay | Admitting: Hematology and Oncology

## 2020-10-06 NOTE — Progress Notes (Signed)
[facility] [phone]   [phone]  PROGRESS NOTE  Patient Care Team: Sigmund Hazel, MD as PCP - General (Family Medicine)  Hematological/Oncological History #Provoked Left Lower Extremity DVT  1) 07/04/2020: [place] LLE showed findings consistent with acute deep vein thrombosis involving the left posterior tibial veins, and left peroneal veins. Started on Xarelto 15 mg PO BID x 21 days, transition to Xarelto 20mg  PO daily. D-dimer 1.49 mg/L 2) 08/03/2020: establish care with Dr. 10/03/2020  3) 10/05/2020: resolution of symptoms after 3 months of anticoagulation. OK to d/c anticoagulation therapy.  Interval History:  Christy Kidd 49 y.o. female with medical history significant for provoked LLE DVT who presents for a follow up visit. The patient's last visit was on 08/03/2020. In the interim since the last visit she has been compliant with her treatment.  On exam today Christy Kidd notes he is not currently having any symptoms in her left lower extremity.  She is not having any swelling, erythema, or pain in her leg.  She notes that she is tolerating Xarelto therapy well without any bleeding, bruising, or dark stools.  She reports that she would like to discontinue anticoagulation therapy if it is feasible at this time.  She does think that her menstrual cycles have become heavier since she was taking Xarelto therapy and reports that she is taking over-the-counter ferrous sulfate medication from [employer].  She notes that she typically always has low energy levels and that she has not experienced any major drop in those levels recently.  Otherwise she denies having issues with fevers, chills, sweats, nausea, vomiting or diarrhea.  Full 10 point ROS is listed below.  MEDICAL HISTORY:  Past Medical History:  Diagnosis Date  . HLD (hyperlipidemia)   . Menorrhagia   . Obesity     SURGICAL HISTORY: No past surgical history on file.  SOCIAL HISTORY: Social History    Socioeconomic History  . Marital status: Divorced    Spouse name: Not on file  . Number of children: 2  . Years of education: Not on file  . Highest education level: Not on file  Occupational History  . Not on file  Tobacco Use  . Smoking status: Never Smoker  . Smokeless tobacco: Never Used  Substance and Sexual Activity  . Alcohol use: Not Currently  . Drug use: Not on file  . Sexual activity: Not on file  Other Topics Concern  . Not on file  Social History Narrative  . Not on file   Social Determinants of Health   Financial Resource Strain:   . Difficulty of Paying Living Expenses: Not on file  Food Insecurity:   . Worried About Christy Kidd in the Last Year: Not on file  . Ran Out of Food in the Last Year: Not on file  Transportation Needs:   . Lack of Transportation (Medical): Not on file  . Lack of Transportation (Non-Medical): Not on file  Physical Activity:   . Days of Exercise per Week: Not on file  . Minutes of Exercise per Session: Not on file  Stress:   . Feeling of Stress : Not on file  Social Connections:   . Frequency of Communication with Friends and Family: Not on file  . Frequency of Social Gatherings with Friends and Family: Not on file  . Attends Religious Services: Not on file  . Active Member of Clubs or Organizations: Not on file  . Attends Programme researcher, broadcasting/film/video Meetings: Not  on file  . Marital Status: Not on file  Intimate Partner Violence:   . Fear of Current or Ex-Partner: Not on file  . Emotionally Abused: Not on file  . Physically Abused: Not on file  . Sexually Abused: Not on file    FAMILY HISTORY: Family History  Problem Relation Age of Onset  . Leukemia Mother   . Healthy Father   . Varicose Veins Paternal Grandmother     ALLERGIES:  has No Known Allergies.  MEDICATIONS:  Current Outpatient Medications  Medication Sig Dispense Refill  . atorvastatin (LIPITOR) 10 MG tablet Take 10 mg by mouth daily.    . B  Complex-C (B-COMPLEX WITH VITAMIN C) tablet     . Calcium-Vitamin D-Vitamin K (VIACTIV CALCIUM PLUS D) 650-12.5-40 MG-MCG-MCG CHEW     . ergocalciferol (VITAMIN D2) 1.25 MG (50000 UT) capsule     . fenofibrate micronized (LOFIBRA) 200 MG capsule Take 200 mg by mouth daily.    . Ferrous Sulfate (PX IRON) 27 MG TABS     . Fluticasone Propionate (FLONASE ALLERGY RELIEF NA)     . Loratadine 10 MG CAPS     . Multiple Vitamin (DAILY VITAMIN FORMULA PO)     . spironolactone (ALDACTONE) 50 MG tablet Take 50 mg by mouth daily.    Carlena Hurl 20 MG TABS tablet Take 20 mg by mouth at bedtime.     No current facility-administered medications for this visit.    REVIEW OF SYSTEMS:   Constitutional: ( - ) fevers, ( - )  chills , ( - ) night sweats Eyes: ( - ) blurriness of vision, ( - ) double vision, ( - ) watery eyes Ears, nose, mouth, throat, and face: ( - ) mucositis, ( - ) sore throat Respiratory: ( - ) cough, ( - ) dyspnea, ( - ) wheezes Cardiovascular: ( - ) palpitation, ( - ) chest discomfort, ( - ) lower extremity swelling Gastrointestinal:  ( - ) nausea, ( - ) heartburn, ( - ) change in bowel habits Skin: ( - ) abnormal skin rashes Lymphatics: ( - ) new lymphadenopathy, ( - ) easy bruising Neurological: ( - ) numbness, ( - ) tingling, ( - ) new weaknesses Behavioral/Psych: ( - ) mood change, ( - ) new changes  All other systems were reviewed with the patient and are negative.  PHYSICAL EXAMINATION: ECOG PERFORMANCE STATUS: 1 - Symptomatic but completely ambulatory  Vitals:   10/05/20 1456  BP: (!) 168/80  Pulse: (!) 102  Resp: 18  Temp: 99.2 F (37.3 C)  SpO2: 98%   Filed Weights   10/05/20 1456  Weight: 222 lb 4.8 oz (100.8 kg)    GENERAL: well appearing middle aged Caucasian female in NAD  SKIN: skin color, texture, turgor are normal, no rashes or significant lesions EYES: conjunctiva are pink and non-injected, sclera clear LUNGS: clear to auscultation and percussion with  normal breathing effort HEART: regular rate & rhythm and no murmurs and no lower extremity edema Musculoskeletal: no cyanosis of digits and no clubbing  PSYCH: alert & oriented x 3, fluent speech NEURO: no focal motor/sensory deficits   LABORATORY DATA:  I have reviewed the data as listed CBC Latest Ref Rng & Units 10/05/2020 08/03/2020  WBC 4.0 - 10.5 K/uL 5.5 6.2  Hemoglobin 12.0 - 15.0 g/dL 11.2(L) 11.2(L)  Hematocrit 36 - 46 % 33.8(L) 34.2(L)  Platelets 150 - 400 K/uL 431(H) 532(H)    CMP Latest Ref Rng &  Units 10/05/2020 08/03/2020  Glucose 70 - 99 mg/dL 195(K) 83  BUN 6 - 20 mg/dL 16 14  Creatinine 9.32 - 1.00 mg/dL 6.71 2.45  Sodium 809 - 145 mmol/L 142 141  Potassium 3.5 - 5.1 mmol/L 4.2 4.2  Chloride 98 - 111 mmol/L 106 107  CO2 22 - 32 mmol/L 28 24  Calcium 8.9 - 10.3 mg/dL 9.4 98.3  Total Protein 6.5 - 8.1 g/dL 7.1 7.6  Total Bilirubin 0.3 - 1.2 mg/dL 0.3 3.8(S)  Alkaline Phos 38 - 126 U/L 32(L) 35(L)  AST 15 - 41 U/L 22 16  ALT 0 - 44 U/L 25 14    No results found for: MPROTEIN No results found for: KPAFRELGTCHN, LAMBDASER, KAPLAMBRATIO   RADIOGRAPHIC STUDIES: No results found.  ASSESSMENT & PLAN Christy Kidd 49 y.o. female with medical history significant for provoked LLE DVT who presents for a follow up visit. The patient's last visit was on 08/03/2020. In the interim since the last visit she has been compliant with her treatment.  After review the labs, review the records, discussion with the patient her findings are most consistent with a treated provoked left lower extremity DVT.  At this time given that her symptoms have resolved and she is not having any residual erythema, swelling, or pain I think would be reasonable to discontinue the Xarelto therapy here at the 26-month mark.    I would recommend strong monitoring for recurrent symptoms of VTE including leg swelling, pain, erythema, chest pain, or shortness of breath.  Given that the clot is resolved  there is no need for any further follow-up in our clinic while off anticoagulation therapy.  If she were to develop symptoms of recurrent VTE or if her normocytic anemia does not resolve we be happy to see her back in clinic in the future.  #Provoked Left Lower Extremity DVT  --patient has completed 3 months of therapy with no residual symptoms in the LLE --OK to d/c anticoagulation with close monitoring for recurrent VTE  #Mild Normocytic Anemia #Thrombocytosis --findings are most consistent with an iron deficiency anemia --patient had heavier cycles on anticoagulation therapy --continue PO OTC Iron supplementation --if this fails to resolve anemia please re-refer for further evaluation and consideration of IV iron.   No orders of the defined types were placed in this encounter.   All questions were answered. The patient knows to call the clinic with any problems, questions or concerns.  A total of more than 30 minutes were spent on this encounter and over half of that time was spent on counseling and coordination of care as outlined above.   Ulysees Barns, MD Department of Hematology/Oncology [facility] Phone: [phone] Pager: [phone] Email: [email or web address]  10/06/2020 2:56 PM

## 2021-06-11 ENCOUNTER — Ambulatory Visit
Admission: RE | Admit: 2021-06-11 | Discharge: 2021-06-11 | Disposition: A | Discharge status: Home / Self Care | Payer: BC Managed Care – PPO | Source: Ambulatory Visit | Attending: Family Medicine | Admitting: Family Medicine

## 2021-06-11 ENCOUNTER — Other Ambulatory Visit: Payer: Self-pay | Admitting: Family Medicine

## 2021-06-11 ENCOUNTER — Other Ambulatory Visit: Payer: Self-pay

## 2021-06-11 DIAGNOSIS — Z1231 Encounter for screening mammogram for malignant neoplasm of breast: Secondary | ICD-10-CM

## 2021-11-11 ENCOUNTER — Encounter (HOSPITAL_BASED_OUTPATIENT_CLINIC_OR_DEPARTMENT_OTHER): Payer: Self-pay | Admitting: *Deleted

## 2021-11-11 ENCOUNTER — Other Ambulatory Visit: Payer: Self-pay

## 2021-11-14 ENCOUNTER — Encounter (HOSPITAL_BASED_OUTPATIENT_CLINIC_OR_DEPARTMENT_OTHER)
Admission: RE | Admit: 2021-11-14 | Discharge: 2021-11-14 | Disposition: A | Discharge status: Home / Self Care | Payer: BC Managed Care – PPO | Source: Ambulatory Visit | Attending: Obstetrics and Gynecology | Admitting: Obstetrics and Gynecology

## 2021-11-14 DIAGNOSIS — Z01812 Encounter for preprocedural laboratory examination: Secondary | ICD-10-CM | POA: Insufficient documentation

## 2021-11-14 DIAGNOSIS — N840 Polyp of corpus uteri: Secondary | ICD-10-CM | POA: Diagnosis not present

## 2021-11-14 DIAGNOSIS — Z6836 Body mass index (BMI) 36.0-36.9, adult: Secondary | ICD-10-CM | POA: Diagnosis not present

## 2021-11-14 DIAGNOSIS — N920 Excessive and frequent menstruation with regular cycle: Secondary | ICD-10-CM | POA: Diagnosis not present

## 2021-11-14 DIAGNOSIS — E669 Obesity, unspecified: Secondary | ICD-10-CM | POA: Diagnosis not present

## 2021-11-14 LAB — BASIC METABOLIC PANEL
Anion gap: 9 (ref 5–15)
BUN: 14 mg/dL (ref 6–20)
CO2: 24 mmol/L (ref 22–32)
Calcium: 9.7 mg/dL (ref 8.9–10.3)
Chloride: 107 mmol/L (ref 98–111)
Creatinine, Ser: 0.69 mg/dL (ref 0.44–1.00)
GFR, Estimated: >60 mL/min (ref >60)
Glucose, Bld: 97 mg/dL (ref 70–99)
Potassium: 4.6 mmol/L (ref 3.5–5.1)
Sodium: 140 mmol/L (ref 135–145)

## 2021-11-19 ENCOUNTER — Other Ambulatory Visit: Payer: Self-pay | Admitting: Obstetrics and Gynecology

## 2021-11-19 DIAGNOSIS — N920 Excessive and frequent menstruation with regular cycle: Secondary | ICD-10-CM

## 2021-11-19 NOTE — H&P (Signed)
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 Does patient report new onset of COVID symptoms? No. Has patient or close contact tested positive for COVID-19? No , not in the past 2 weeks.  General 50 yo pt presents for a pre-op visit for hysteroscopy D&C possible polypectomy and hydrothermal ablation for the management of endometrial mass and menorrhagia with regular cycle.  Pt was seen on 07/15/2021 by Lamonte Sakai NP.. with AUB. H/o VTE after bee sting and subsequent immobility ~1 year ago. Received anticoagulation (Xarelto). d/c COC, which was previously helping manage heavy menstrual bleeding. Now taking norethindrone 0.35mg  daily for contraception x 1 month. Between d/c COC and starting POP, reports frequent, heavy bleeding (i.e. bleeding x 2 weeks, off 1 week, bleeding x 3 weeks). Outside pelvic US 06/17/2021 reviewed: notable for enlarged fibroid uterus measuring 12.3x7.5x8.5cm. Fibroids: - 2.3cm IM left ant uterine body - 3.3cm IM right post uterine body - 3.9cm IM left ant uterine fundus - 3.6cm IM right post uterine fundus - 2.5cm IM central uterine fundus Endometrium 6.59mm w hyperechoic 70mm lesion. Bilateral ovaries/adnexa normal. Last Pap 04/2019 NILM, HRHPV neg. Denied new sexual partner, pelvic pain, abnormal vaginal discharge/odor/irritation, or urinary symptoms. Pt was last seen on 08/12/2021 and patient reported really heavy periods and was sent for evaluation by Dr. Sigmund Hazel. lMP was 08/05/2021 and lasted 6 days. On her heaviest day she changes an overnight pad every 1 1/2 hours. she reported hygiene accidents. she has been on norethindrone for 2 months. prior to starting the norethindrone she had bleeding between her menses however that has resolved since starting the norethindrone. Current Medication: Taking  Atorvastatin Calcium 10 mg Tablet TAKE 1 TABLET ONCE DAILY. ,  Notes: Christy Kidd.     Fenofibrate Micronized 200 mg Capsule TAKE 1 CAPSULE DAILY WITH A MEAL. , Notes: Christy Kidd.     Ferrous Sulfate 325 (65 Fe) MG Tablet 1 tablet Orally M,W,F, Notes: Christy Kidd.     Flonase(Fluticasone Propionate) 50 MCG/ACT Suspension 1 spray in each nostril Nasally Once a day as needed, Notes: as needed.     Loratadine 10 MG Tablet 1 tablet Orally Once a day, Notes: otc.     Multi-Vitamin Tablet 1 tab Orally once a week, Notes: on Sundays.     Norethindrone 0.35 MG Tablet 1 tablet Orally Once a day.     Spironolactone 50 mg Tablet TAKE ONE TABLET BY MOUTH ONCE DAILY WITH FOOD.     Vitamin B Complex - Tablet as directed Orally , Notes: m,w,f.     Vitamin D (Ergocalciferol) 1.25 MG (50000 UT) Capsule TAKE ONE CAPSULE THREE TIMES A WEEK.     Medication List reviewed and reconciled with the patient.  Medical History:  mild dysthymia     rosacea     hypertriglycerides     gestational diabetes in 2nd pregnancy     mild seasonal allergies     High TG, low HDL 5/15 , same 5/17 , same 5/18     Low Vit D 5/15 , same 5/17, 5/18     Abn mammo 6/15, needs further imaging L breast, nl repeat 1 year     Hirsutism 5/18     Menorrhagia anemia 5/18      Allergies/Intolerance: N.K.D.A. Gyn History:  Sexual activity not currently sexually active. Periods : irregular. LMP 10/30/2021. Birth control Norethindrone. Last pap smear date 04/2019- Normal. Last mammogram date 06/11/2021-normal. Denies  Abnormal pap smear. Denies STD. Menarche ~10/11.   OB History:  Number of pregnancies 2. Pregnancy # 1 girl, vaginal delivery. Pregnancy # 2 boy, vaginal delivery.   Surgical History:  broken elbow in 75   Hospitalization:  No Hospitalization History.   Family History:  Father: alive 52 yrs, osteoporosis, skin cancer    Mother: deceased 56 yrs, lyme disease, follicular cancer, seizures, pinched spine,fibromyalgia,arthritis, hip issuees, chronic fatique,immune system. Died of lymphoma  04-29-2023.    Paternal Grand Mother: arthritis, diagnosed with Hypertension    Maternal Grand Father: smoker, diagnosed with Lung Cancer    Daughter(s): alive 22 yrs, ADHD, dysgraphia,anxiety    Son(s): alive 18 yrs, anxiety/depression/allergies    Maternal Grand Mother: diagnosed with Hypertension   cousin with colon cancer.  Social History: General Tobacco use cigarettes: Never smoked, Tobacco history last updated 11/05/2021.  EXPOSURE TO PASSIVE SMOKE: no, never.  no Alcohol.  Caffeine: rare.  no Recreational drug use.  no DIET.  no Exercise.  Marital Status: Divorced.  Children: 2, 1 girl 1 more yr in college, 1 son 1 more yr in HS Christy Kidd,LISA 04/27/2018 10:41:25 AM >.  EDUCATION: College.  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  no Tobacco Exposure.  ROS: CONSTITUTIONAL No" label="Chills" value="" options="no,yes" propid="91" itemid="193425" categoryid="10464" encounterid="[id number]"Chills No. No" label="Fatigue" value="" options="no,yes" propid="91" itemid="172899" categoryid="10464" encounterid="[id number]"Fatigue No. No" label="Fever" value="" options="no,yes" propid="91" itemid="10467" categoryid="10464" encounterid="[id number]"Fever No. No" label="Night sweats" value="" options="no,yes" propid="91" itemid="193426" categoryid="10464" encounterid="[id number]"Night sweats No. No" label="Recent travel outside [place]" value="" options="no,yes" propid="91" itemid="444261" categoryid="10464" encounterid="[id number]"Recent travel outside [place] No. No" label="Sweats" value="" options="no,yes" propid="91" itemid="193427" categoryid="10464" encounterid="[id number]"Sweats No. No" label="Weight change" value="" options="no,yes" propid="91" itemid="194825" categoryid="10464" encounterid="[id number]"Weight change No.  OPHTHALMOLOGY no" label="Blurring of vision" value="" options="no,yes" propid="91" itemid="12520" categoryid="12516"  encounterid="[id number]"Blurring of vision no. no" label="Change in vision" value="" options="no,yes" propid="91" itemid="193469" categoryid="12516" encounterid="[id number]"Change in vision no. no" label="Double vision" value="" options="no,yes" propid="91" itemid="194379" categoryid="12516" encounterid="[id number]"Double vision no.  ENT no" label="Dizziness" value="" options="no,yes" propid="91" itemid="193612" categoryid="10481" encounterid="[id number]"Dizziness no. Nose bleeds no. Sore throat no. Teeth pain no.  ALLERGY no" label="Hives" value="" options="no,yes" propid="91" itemid="202589" categoryid="138152" encounterid="[id number]"Hives no.  CARDIOLOGY no" label="Chest pain" value="" options="no,yes" propid="91" itemid="193603" categoryid="10488" encounterid="[id number]"Chest pain no. no" label="High blood pressure" value="" options="no,yes" propid="91" itemid="199089" categoryid="10488" encounterid="[id number]"High blood pressure no. no" label="Irregular heart beat" value="" options="no,yes" propid="91" itemid="202598" categoryid="10488" encounterid="[id number]"Irregular heart beat no. no" label="Leg edema" value="" options="no,yes" propid="91" itemid="10491" categoryid="10488" encounterid="[id number]"Leg edema no. no" label="Palpitations" value="" options="no,yes" propid="91" itemid="10490" categoryid="10488" encounterid="[id number]"Palpitations no.  RESPIRATORY no" label="Shortness of breath" value="" options="no" propid="91" itemid="270013" categoryid="138132" encounterid="[id number]"Shortness of breath no. no" label="Cough" value="" options="no,yes" propid="91" itemid="172745" categoryid="138132" encounterid="14526158"Cough no. no" label="Wheezing" value="" options="no,yes" propid="91" itemid="193621" categoryid="138132" encounterid="14526158"Wheezing no.  UROLOGY no" label="Pain with urination" value="" options="no,yes" propid="91" itemid="194377" categoryid="138166" encounterid="14526158"Pain with urination no. no"  label="Urinary urgency" value="" options="no,yes" propid="91" itemid="193493" categoryid="138166" encounterid="14526158"Urinary urgency no. no" label="Urinary frequency" value="" options="no,yes" propid="91" itemid="193492" categoryid="138166" encounterid="14526158"Urinary frequency no. no" label="Urinary incontinence" value="" options="no,yes" propid="91" itemid="138171" categoryid="138166" encounterid="14526158"Urinary incontinence no. No" label="Difficulty urinating" value="" options="no,yes" propid="91" itemid="138167" categoryid="138166" encounterid="14526158"Difficulty urinating No. No" label="Blood in urine" value="" options="no,yes" propid="91" itemid="138168" categoryid="138166" encounterid="14526158"Blood in urine No.  GASTROENTEROLOGY no" label="Abdominal pain" value="" options="no,yes" propid="91" itemid="10496" categoryid="10494" encounterid="14526158"Abdominal pain no. no" label="Appetite change" value="" options="no,yes" propid="91" itemid="193447" categoryid="10494" encounterid="14526158"Appetite change no. no" label="Bloating/belching" value="" options="no,yes" propid="91" itemid="193448" categoryid="10494" encounterid="14526158"Bloating/belching no. no" label="Blood in stool or on toilet paper" value="" options="no,yes" propid="91" itemid="10503" categoryid="10494" encounterid="14526158"Blood in stool or on toilet paper no. no" label="Change in bowel movements" value="" options="no,yes" propid="91" itemid="199106" categoryid="10494" encounterid="14526158"Change in bowel movements no. no" label="Constipation"  value="" options="no,yes" propid="91" itemid="10501" categoryid="10494" encounterid="14526158"Constipation no. no" label="Diarrhea" value="" options="no,yes" propid="91" itemid="10502" categoryid="10494" encounterid="14526158"Diarrhea no. no" label="Difficulty swallowing" value="" options="no,yes" propid="91" itemid="199104" categoryid="10494" encounterid="14526158"Difficulty swallowing no.  no" label="Nausea" value="" options="no,yes" propid="91" itemid="10499" categoryid="10494" encounterid="14526158"Nausea no.  FEMALE REPRODUCTIVE no" label="Vulvar pain" value="" options="no,yes" propid="91" itemid="453725" categoryid="10525" encounterid="[id number]"Vulvar pain no. no" label="Vulvar rash" value="" options="no,yes" propid="91" itemid="453726" categoryid="10525" encounterid="14526158"Vulvar rash no. no" label="Abnormal vaginal bleeding" value="" options="no, yes" propid="91" itemid="444315" categoryid="10525" encounterid="14526158"Abnormal vaginal bleeding no. no" label="Breast pain" value="" options="no,yes" propid="91" itemid="186083" categoryid="10525" encounterid="14526158"Breast pain no. no" label="Nipple discharge" value="" options="no,yes" propid="91" itemid="186084" categoryid="10525" encounterid="14526158"Nipple discharge no. no" label="Pain with intercourse" value="" options="no,yes" propid="91" itemid="275823" categoryid="10525" encounterid="14526158"Pain with intercourse no. no" label="Pelvic pain" value="" options="no,yes" propid="91" itemid="186082" categoryid="10525" encounterid="14526158"Pelvic pain no. no" label="Unusual vaginal discharge" value="" options="no,yes" propid="91" itemid="278230" categoryid="10525" encounterid="14526158"Unusual vaginal discharge no. no" label="Vaginal itching" value="" options="no,yes" propid="91" itemid="278942" categoryid="10525" encounterid="14526158"Vaginal itching no.  MUSCULOSKELETAL no" label="Muscle aches" value="" options="no,yes" propid="91" itemid="193461" categoryid="10514" encounterid="14526158"Muscle aches no.  NEUROLOGY no" label="Headache" value="" options="no,yes" propid="91" itemid="12513" categoryid="12512" encounterid="14526158"Headache no. no" label="Tingling/numbness" value="" options="no,yes" propid="91" itemid="12514" categoryid="12512" encounterid="14526158"Tingling/numbness no. no" label="Weakness" value="" options="no,yes"  propid="91" itemid="193468" categoryid="12512" encounterid="14526158"Weakness no.  PSYCHOLOGY no" label="Depression" value="" options="" propid="91" itemid="275919" categoryid="10520" encounterid="14526158"Depression no. no" label="Anxiety" value="" options="no,yes" propid="91" itemid="172748" categoryid="10520" encounterid="14526158"Anxiety no. no" label="Nervousness" value="" options="no,yes" propid="91" itemid="199158" categoryid="10520" encounterid="14526158"Nervousness no. no" label="Sleep disturbances" value="" options="no,yes" propid="91" itemid="12502" categoryid="10520" encounterid="14526158"Sleep disturbances no. no " label="Suicidal ideation" value="" options="no,yes" propid="91" itemid="72718" categoryid="10520" encounterid="14526158"Suicidal ideation no .  ENDOCRINOLOGY no" label="Excessive thirst" value="" options="no,yes" propid="91" itemid="194628" categoryid="12508" encounterid="14526158"Excessive thirst no. no" label="Excessive urination" value="" options="no,yes" propid="91" itemid="196285" categoryid="12508" encounterid="14526158"Excessive urination no. no" label="Hair loss" value="" options="no, yes" propid="91" itemid="444314" categoryid="12508" encounterid="14526158"Hair loss no. no" label="Heat or cold intolerance" value="" options="" propid="91" itemid="447284" categoryid="12508" encounterid="14526158"Heat or cold intolerance no.  HEMATOLOGY/LYMPH no" label="Abnormal bleeding" value="" options="no,yes" propid="91" itemid="199152" categoryid="138157" encounterid="14526158"Abnormal bleeding no. no" label="Easy bruising" value="" options="no,yes" propid="91" itemid="170653" categoryid="138157" encounterid="14526158"Easy bruising no. no" label="Swollen glands" value="" options="no,yes" propid="91" itemid="138158" categoryid="138157" encounterid="14526158"Swollen glands no.  DERMATOLOGY no" label="New/changing skin lesion" value="" options="no,yes" propid="91" itemid="199126"  categoryid="12503" encounterid="14526158"New/changing skin lesion no. no" label="Rash" value="" options="no,yes" propid="91" itemid="12504" categoryid="12503" encounterid="14526158"Rash no. no" label="Sores" value="" options="" propid="91" itemid="444313" categoryid="12503" encounterid="14526158"Sores no.  Negative except as stated in HPI.  Objective: Vitals: Wt 217.4, Wt change -3.4 lbs, Ht 64, BMI 37.31, Pulse sitting 92, BP sitting 136/85.  Past Results: Examination:  General Examination alert, oriented, NAD" label="CONSTITUTIONAL:" categoryPropId="10089" examid="193638"CONSTITUTIONAL: alert, oriented, NAD.  moist, warm" label="SKIN:" categoryPropId="10109" examid="193638"SKIN: moist, warm.  Conjunctiva clear" label="EYES:" categoryPropId="21468" examid="193638"EYES: Conjunctiva clear.  LUNGS: good I:E efffort noted, clear to auscultation bilaterally.  HEART: heart sounds are normal, rhythm is regular, no murmur.  soft, non-tender/non-distended, bowel sounds present" label="ABDOMEN:" categoryPropId="88" examid="193638"ABDOMEN: soft, non-tender/non-distended, bowel sounds present.  normal external genitalia, labia - unremarkable, vagina - pink moist mucosa, no lesions or abnormal discharge, cervix - no discharge or lesions or CMT, adnexa - no masses or tenderness, uterus - nontender and normal size on palpation" label="FEMALE GENITOURINARY:" categoryPropId="13414" examid="193638"FEMALE GENITOURINARY: normal external genitalia, labia - unremarkable, vagina - pink moist mucosa, no lesions or abnormal discharge, cervix - no discharge or lesions or CMT, adnexa - no masses or tenderness, uterus - nontender and normal size on palpation.  affect normal, good eye contact" label="PSYCH:" categoryPropId="16316" examid="193638"PSYCH: affect normal, good eye contact.  Physical Examination: Chaperone present Chaperone present Debby Freiberg 11/05/2021 04:23:00 PM > , for pelvic exam.    Assessment:  Assessment:  Menorrhagia with regular cycle - N92.0 (Primary), stable Christy Kidd     Endometrial mass - N94.89     Plan: Treatment: Menorrhagia with  regular cycle Notes: Planning hysteroscopy D&C possible polypectomy and hydrothermal ablation for the management of endometrial mass and menorrhagia with regular cycle. Pt is advised she will be able to return home the same day if she is doing well. Discussed risks of hysteroscopy including but not limited to infection, bleeding, possible perforation of the uterus, with the need for further surgery. Pt accepts these risks. Pt advised to avoid NSAIDs (Aspirin, Aleve, Advil, Ibuprofen, Motrin) from now until surgery given risk of bleeding during surgery. She may take Tylenol for pain management. She is advised to avoid eating or drinking starting midnight prior to surgery. Pt is advised that she may have watery discharge or cramping after surgery. Discussed post-surgery avoidance of driving for 24 hours or intercourse for 2 weeks after procedure. . Endometrial mass Notes: Planning hysteroscopy D&C possible polypectomy and hydrothermal ablation for the management of endometrial mass and menorrhagia with regular cycle. Pt is advised she will be able to return home the same day if she is doing well. Discussed risks of hysteroscopy including but not limited to infection, bleeding, possible perforation of the uterus, with the need for further surgery. Pt accepts these risks. Pt advised to avoid NSAIDs (Aspirin, Aleve, Advil, Ibuprofen, Motrin) from now until surgery given risk of bleeding during surgery. She may take Tylenol for pain management. She is advised to avoid eating or drinking starting midnight prior to surgery. Pt is advised that she may have watery discharge or cramping after surgery. Discussed post-surgery avoidance of driving for 24 hours or intercourse for 2 weeks after procedure.

## 2021-11-20 ENCOUNTER — Ambulatory Visit (HOSPITAL_BASED_OUTPATIENT_CLINIC_OR_DEPARTMENT_OTHER): Payer: BC Managed Care – PPO | Admitting: Anesthesiology

## 2021-11-20 ENCOUNTER — Encounter (HOSPITAL_BASED_OUTPATIENT_CLINIC_OR_DEPARTMENT_OTHER): Payer: Self-pay | Admitting: Obstetrics and Gynecology

## 2021-11-20 ENCOUNTER — Other Ambulatory Visit: Payer: Self-pay

## 2021-11-20 ENCOUNTER — Ambulatory Visit (HOSPITAL_BASED_OUTPATIENT_CLINIC_OR_DEPARTMENT_OTHER)
Admission: RE | Admit: 2021-11-20 | Discharge: 2021-11-20 | Disposition: A | Discharge status: Home / Self Care | Payer: BC Managed Care – PPO | Attending: Obstetrics and Gynecology | Admitting: Obstetrics and Gynecology

## 2021-11-20 ENCOUNTER — Encounter (HOSPITAL_BASED_OUTPATIENT_CLINIC_OR_DEPARTMENT_OTHER)
Admission: RE | Disposition: A | Discharge status: Home / Self Care | Payer: Self-pay | Source: Home / Self Care | Attending: Obstetrics and Gynecology

## 2021-11-20 DIAGNOSIS — Z6836 Body mass index (BMI) 36.0-36.9, adult: Secondary | ICD-10-CM | POA: Insufficient documentation

## 2021-11-20 DIAGNOSIS — E669 Obesity, unspecified: Secondary | ICD-10-CM | POA: Insufficient documentation

## 2021-11-20 DIAGNOSIS — Z01818 Encounter for other preprocedural examination: Secondary | ICD-10-CM

## 2021-11-20 DIAGNOSIS — N840 Polyp of corpus uteri: Secondary | ICD-10-CM | POA: Diagnosis not present

## 2021-11-20 DIAGNOSIS — N920 Excessive and frequent menstruation with regular cycle: Secondary | ICD-10-CM | POA: Insufficient documentation

## 2021-11-20 HISTORY — PX: DILITATION & CURRETTAGE/HYSTROSCOPY WITH HYDROTHERMAL ABLATION: SHX5570

## 2021-11-20 LAB — HEMOGLOBIN AND HEMATOCRIT, BLOOD
HCT: 39.3 % (ref 36.0–46.0)
Hemoglobin: 13.9 g/dL (ref 12.0–15.0)

## 2021-11-20 LAB — POCT PREGNANCY, URINE: Preg Test, Ur: NEGATIVE

## 2021-11-20 SURGERY — DILATATION & CURETTAGE/HYSTEROSCOPY WITH HYDROTHERMAL ABLATION
Anesthesia: General

## 2021-11-20 MED ORDER — PROMETHAZINE HCL 25 MG/ML IJ SOLN: 6.2500 mg | INTRAMUSCULAR | Status: DC | PRN

## 2021-11-20 MED ORDER — IBUPROFEN 800 MG PO TABS: 800.0000 mg | ORAL_TABLET | Freq: Three times a day (TID) | ORAL | 0 refills | Status: AC | PRN

## 2021-11-20 MED ORDER — SODIUM CHLORIDE 0.9 % IR SOLN
Status: DC | PRN
  Administered 2021-11-20: 3000 mL

## 2021-11-20 MED ORDER — LACTATED RINGERS IV SOLN: INTRAVENOUS | Status: DC

## 2021-11-20 MED ORDER — PROPOFOL 10 MG/ML IV BOLUS
INTRAVENOUS | Status: AC
  Filled 2021-11-20: qty 20

## 2021-11-20 MED ORDER — FENTANYL CITRATE (PF) 100 MCG/2ML IJ SOLN
INTRAMUSCULAR | Status: AC
  Filled 2021-11-20: qty 2

## 2021-11-20 MED ORDER — ONDANSETRON HCL 4 MG/2ML IJ SOLN
INTRAMUSCULAR | Status: AC
  Filled 2021-11-20: qty 2

## 2021-11-20 MED ORDER — OXYCODONE HCL 5 MG PO TABS: 5.0000 mg | ORAL_TABLET | Freq: Once | ORAL | Status: DC | PRN

## 2021-11-20 MED ORDER — PROPOFOL 10 MG/ML IV BOLUS
INTRAVENOUS | Status: DC | PRN
  Administered 2021-11-20: 200 mg via INTRAVENOUS

## 2021-11-20 MED ORDER — DEXAMETHASONE SODIUM PHOSPHATE 10 MG/ML IJ SOLN
INTRAMUSCULAR | Status: DC | PRN
  Administered 2021-11-20: 5 mg via INTRAVENOUS

## 2021-11-20 MED ORDER — OXYCODONE HCL 5 MG/5ML PO SOLN: 5.0000 mg | Freq: Once | ORAL | Status: DC | PRN

## 2021-11-20 MED ORDER — MEPERIDINE HCL 25 MG/ML IJ SOLN
6.2500 mg | INTRAMUSCULAR | Status: DC | PRN
Start: 2021-11-20 — End: 2021-11-20

## 2021-11-20 MED ORDER — LIDOCAINE 2% (20 MG/ML) 5 ML SYRINGE
INTRAMUSCULAR | Status: AC
  Filled 2021-11-20: qty 5

## 2021-11-20 MED ORDER — ACETAMINOPHEN 500 MG PO TABS
1000.0000 mg | ORAL_TABLET | ORAL | Status: AC
  Administered 2021-11-20: 12:00:00 1000 mg via ORAL

## 2021-11-20 MED ORDER — LIDOCAINE HCL (CARDIAC) PF 100 MG/5ML IV SOSY
PREFILLED_SYRINGE | INTRAVENOUS | Status: DC | PRN
  Administered 2021-11-20: 60 mg via INTRATRACHEAL

## 2021-11-20 MED ORDER — MIDAZOLAM HCL 2 MG/2ML IJ SOLN
INTRAMUSCULAR | Status: AC
  Filled 2021-11-20: qty 2

## 2021-11-20 MED ORDER — HYDROMORPHONE HCL 1 MG/ML IJ SOLN: 0.2500 mg | INTRAMUSCULAR | Status: DC | PRN

## 2021-11-20 MED ORDER — ACETAMINOPHEN 500 MG PO TABS
ORAL_TABLET | ORAL | Status: AC
  Filled 2021-11-20: qty 2

## 2021-11-20 MED ORDER — DEXAMETHASONE SODIUM PHOSPHATE 10 MG/ML IJ SOLN
INTRAMUSCULAR | Status: AC
  Filled 2021-11-20: qty 1

## 2021-11-20 MED ORDER — FENTANYL CITRATE (PF) 100 MCG/2ML IJ SOLN
INTRAMUSCULAR | Status: DC | PRN
  Administered 2021-11-20 (×2): 25 ug via INTRAVENOUS
  Administered 2021-11-20: 50 ug via INTRAVENOUS

## 2021-11-20 MED ORDER — SILVER NITRATE-POT NITRATE 75-25 % EX MISC
CUTANEOUS | Status: DC | PRN
  Administered 2021-11-20: 2

## 2021-11-20 MED ORDER — ONDANSETRON HCL 4 MG/2ML IJ SOLN
INTRAMUSCULAR | Status: DC | PRN
  Administered 2021-11-20: 4 mg via INTRAVENOUS

## 2021-11-20 MED ORDER — SILVER NITRATE-POT NITRATE 75-25 % EX MISC
CUTANEOUS | Status: AC
  Filled 2021-11-20: qty 10

## 2021-11-20 MED ORDER — BUPIVACAINE HCL (PF) 0.25 % IJ SOLN
INTRAMUSCULAR | Status: DC | PRN
  Administered 2021-11-20: 20 mL

## 2021-11-20 MED ORDER — MIDAZOLAM HCL 5 MG/5ML IJ SOLN
INTRAMUSCULAR | Status: DC | PRN
  Administered 2021-11-20: 2 mg via INTRAVENOUS

## 2021-11-20 MED ORDER — POVIDONE-IODINE 10 % EX SWAB
2.0000 "application " | Freq: Once | CUTANEOUS | Status: AC
  Administered 2021-11-20: 2 via TOPICAL

## 2021-11-20 MED ORDER — AMISULPRIDE (ANTIEMETIC) 5 MG/2ML IV SOLN: 10.0000 mg | Freq: Once | INTRAVENOUS | Status: DC | PRN

## 2021-11-20 SURGICAL SUPPLY — 21 items
CATH ROBINSON RED A/P 16FR (CATHETERS) ×2 IMPLANT
DECANTER SPIKE VIAL GLASS SM (MISCELLANEOUS) ×4 IMPLANT
DEVICE MYOSURE LITE (MISCELLANEOUS) ×2 IMPLANT
DILATOR CANAL MILEX (MISCELLANEOUS) ×2 IMPLANT
DRSG TELFA 3X8 NADH (GAUZE/BANDAGES/DRESSINGS) ×3 IMPLANT
ELECT REM PT RETURN 9FT ADLT (ELECTROSURGICAL)
ELECTRODE REM PT RTRN 9FT ADLT (ELECTROSURGICAL) IMPLANT
GAUZE 4X4 16PLY ~~LOC~~+RFID DBL (SPONGE) ×4 IMPLANT
GLOVE SURG ENC TEXT LTX SZ6.5 (GLOVE) ×8 IMPLANT
GLOVE SURG UNDER POLY LF SZ6.5 (GLOVE) ×4 IMPLANT
GLOVE SURG UNDER POLY LF SZ7 (GLOVE) ×4 IMPLANT
GOWN STRL REUS W/ TWL LRG LVL3 (GOWN DISPOSABLE) ×4 IMPLANT
GOWN STRL REUS W/TWL LRG LVL3 (GOWN DISPOSABLE) ×6
KIT PROCEDURE FLUENT (KITS) ×2 IMPLANT
PACK VAGINAL MINOR WOMEN LF (CUSTOM PROCEDURE TRAY) ×4 IMPLANT
PAD DRESSING TELFA 3X8 NADH (GAUZE/BANDAGES/DRESSINGS) IMPLANT
PAD OB MATERNITY 4.3X12.25 (PERSONAL CARE ITEMS) ×4 IMPLANT
SET GENESYS HTA PROCERVA (MISCELLANEOUS) ×2 IMPLANT
SLEEVE SCD COMPRESS KNEE MED (STOCKING) ×4 IMPLANT
TOWEL GREEN STERILE FF (TOWEL DISPOSABLE) ×8 IMPLANT
UNDERPAD 30X36 HEAVY ABSORB (UNDERPADS AND DIAPERS) ×4 IMPLANT

## 2021-11-20 NOTE — Anesthesia Preprocedure Evaluation (Addendum)
Anesthesia Evaluation  °Patient identified by MRN, date of birth, ID band °Patient awake ° ° ° °Reviewed: °Allergy & Precautions, NPO status , Patient's Chart, lab work & pertinent test results ° °Airway °Mallampati: II ° °TM Distance: >3 FB °Neck ROM: Full ° ° ° Dental °no notable dental hx. ° °  °Pulmonary °neg pulmonary ROS,  °  °Pulmonary exam normal °breath sounds clear to auscultation ° ° ° ° ° ° Cardiovascular °negative cardio ROS °Normal cardiovascular exam °Rhythm:Regular Rate:Normal ° ° °  °Neuro/Psych °negative neurological ROS ° negative psych ROS  ° GI/Hepatic °negative GI ROS, Neg liver ROS,   °Endo/Other  °negative endocrine ROS ° Renal/GU °negative Renal ROS  °negative genitourinary °  °Musculoskeletal °negative musculoskeletal ROS °(+)  ° Abdominal °(+) + obese,   °Peds °negative pediatric ROS °(+)  Hematology °negative hematology ROS °(+)   °Anesthesia Other Findings ° ° Reproductive/Obstetrics °negative OB ROS ° °  ° ° ° ° ° ° ° ° ° ° ° ° ° °  °  ° ° ° ° ° ° ° ° °Anesthesia Physical °Anesthesia Plan ° °ASA: 2 ° °Anesthesia Plan: General  ° °Post-op Pain Management:   ° °Induction: Intravenous ° °PONV Risk Score and Plan: 3 and Ondansetron, Dexamethasone, Midazolam and Treatment may vary due to age or medical condition ° °Airway Management Planned: LMA ° °Additional Equipment:  ° °Intra-op Plan:  ° °Post-operative Plan: Extubation in OR ° °Informed Consent: I have reviewed the patients History and Physical, chart, labs and discussed the procedure including the risks, benefits and alternatives for the proposed anesthesia with the patient or authorized representative who has indicated his/her understanding and acceptance.  ° ° ° °Dental advisory given ° °Plan Discussed with: CRNA ° °Anesthesia Plan Comments:   ° ° ° ° ° ° °Anesthesia Quick Evaluation ° °

## 2021-11-20 NOTE — Anesthesia Procedure Notes (Signed)
Procedure Name: LMA Insertion Date/Time: 11/20/2021 12:45 PM Performed by: Thornell Mule, CRNA Pre-anesthesia Checklist: Patient identified, Emergency Drugs available, Suction available and Patient being monitored Patient Re-evaluated:Patient Re-evaluated prior to induction Oxygen Delivery Method: Circle system utilized Preoxygenation: Pre-oxygenation with 100% oxygen Induction Type: IV induction LMA: LMA inserted LMA Size: 4.0 Number of attempts: 1 Placement Confirmation: positive ETCO2 Tube secured with: Tape Dental Injury: Teeth and Oropharynx as per pre-operative assessment

## 2021-11-20 NOTE — Anesthesia Postprocedure Evaluation (Signed)
Anesthesia Post Note  Patient: Christy Kidd  Procedure(s) Performed: HYSTEROSCOPY DILATATION & CURETTAGE,   POLPYECTOMY WITH MYOSURE and  HYDROTHERMAL ABLATION     Patient location during evaluation: PACU Anesthesia Type: General Level of consciousness: awake and alert Pain management: pain level controlled Vital Signs Assessment: post-procedure vital signs reviewed and stable Respiratory status: spontaneous breathing, nonlabored ventilation and respiratory function stable Cardiovascular status: blood pressure returned to baseline and stable Postop Assessment: no apparent nausea or vomiting Anesthetic complications: no   No notable events documented.  Last Vitals:  Vitals:   11/20/21 1445 11/20/21 1457  BP: 132/80 (!) 143/79  Pulse: 89 86  Resp: 17 17  Temp:  36.9 C  SpO2: 94% 96%    Last Pain:  Vitals:   11/20/21 1457  TempSrc:   PainSc: 2                  Lowella Curb

## 2021-11-20 NOTE — Transfer of Care (Signed)
Immediate Anesthesia Transfer of Care Note  Patient: Christy Kidd  Procedure(s) Performed: HYSTEROSCOPY DILATATION & CURETTAGE,   POLPYECTOMY WITH MYOSURE and  HYDROTHERMAL ABLATION  Patient Location: PACU  Anesthesia Type:General  Level of Consciousness: drowsy, patient cooperative and responds to stimulation  Airway & Oxygen Therapy: Patient Spontanous Breathing and Patient connected to face mask oxygen  Post-op Assessment: Report given to RN and Post -op Vital signs reviewed and stable  Post vital signs: Reviewed and stable  Last Vitals:  Vitals Value Taken Time  BP    Temp    Pulse 92 11/20/21 1410  Resp    SpO2 97 % 11/20/21 1410  Vitals shown include unvalidated device data.  Last Pain:  Vitals:   11/20/21 1145  TempSrc: Oral  PainSc: 0-No pain         Complications: No notable events documented.

## 2021-11-20 NOTE — H&P (Signed)
Date of Initial H&P: 11/19/2021  History reviewed, patient examined, no change in status, stable for surgery.

## 2021-11-20 NOTE — Op Note (Signed)
11/20/2021  2:20 PM  PATIENT:  Christy Kidd  50 y.o. female  PRE-OPERATIVE DIAGNOSIS:  Endometrial Mass  POST-OPERATIVE DIAGNOSIS:  Endometrial Mass  PROCEDURE:  Procedure(s): HYSTEROSCOPY DILATATION & CURETTAGE,   POLPYECTOMY WITH MYOSURE and  HYDROTHERMAL ABLATION (N/A)  SURGEON:  Surgeon(s) and Role:    Gerald Leitz, MD - Primary  PHYSICIAN ASSISTANT:   ASSISTANTS: none   ANESTHESIA:   general  EBL:  20 mL   BLOOD ADMINISTERED:none  DRAINS: none   LOCAL MEDICATIONS USED:  MARCAINE     SPECIMEN:  Source of Specimen:  endometrial curretting and possible polyp  DISPOSITION OF SPECIMEN:  PATHOLOGY  COUNTS:  YES  TOURNIQUET:  * No tourniquets in log *  DICTATION: .Note written in EPIC  PLAN OF CARE: Discharge to home after PACU  PATIENT DISPOSITION:  PACU - hemodynamically stable.   Delay start of Pharmacological VTE agent (>24hrs) due to surgical blood loss or risk of bleeding: not applicable  Findings: normal external genitalia, vaginal mucosa and cervix.. fundal endometrial polyp noted.  Procedure: Patient was taken to the operating room where she was placed under general anesthesia. She was placed in the dorsal lithotomy position. She was prepped and draped in the usual sterile fashion. A speculum was placed into the vaginal vault. The anterior lip of the cervix was grasped with a single-tooth tenaculum. Quarter percent Marcaine was injected at the 4 and 8:00 positions of the cervix. The cervix was then sounded to 8 cm. The cervix was dilated to approximately 6 mm. Mysosure operative  hysteroscope was inserted. The findings noted above. Myosure reach  blade was introduced throught the hysteroscope. The endometrial mass was removed within 2 minute.  There was no evidence of perforation.    The cervix was dilated to 8 mm. The hydrothermal hysteroscope was inserted. The ostia were visualized bilaterally. There was no evidence of perforation. The hydrothermal  ablation was performed for 10 minutes.  The hysteroscope was removed. Sharp curettage was performed.  The single tooth tenaculum was removed.  Excellent hemostasis was noted.   Sponge lap and needle counts were correct x 2.  The patient was awakened from anesthesia and taken to the recovery room in stable condition.

## 2021-11-20 NOTE — Discharge Instructions (Signed)
Next dose of Tylenol after 6pm as needed for pain.   Post Anesthesia Home Care Instructions  Activity: Get plenty of rest for the remainder of the day. A responsible individual must stay with you for 24 hours following the procedure.  For the next 24 hours, DO NOT: -Drive a car -Advertising copywriter -Drink alcoholic beverages -Take any medication unless instructed by your physician -Make any legal decisions or sign important papers.  Meals: Start with liquid foods such as gelatin or soup. Progress to regular foods as tolerated. Avoid greasy, spicy, heavy foods. If nausea and/or vomiting occur, drink only clear liquids until the nausea and/or vomiting subsides. Call your physician if vomiting continues.  Special Instructions/Symptoms: Your throat may feel dry or sore from the anesthesia or the breathing tube placed in your throat during surgery. If this causes discomfort, gargle with warm salt water. The discomfort should disappear within 24 hours.  If you had a scopolamine patch placed behind your ear for the management of post- operative nausea and/or vomiting:  1. The medication in the patch is effective for 72 hours, after which it should be removed.  Wrap patch in a tissue and discard in the trash. Wash hands thoroughly with soap and water. 2. You may remove the patch earlier than 72 hours if you experience unpleasant side effects which may include dry mouth, dizziness or visual disturbances. 3. Avoid touching the patch. Wash your hands with soap and water after contact with the patch.

## 2021-11-21 ENCOUNTER — Encounter (HOSPITAL_BASED_OUTPATIENT_CLINIC_OR_DEPARTMENT_OTHER): Payer: Self-pay | Admitting: Obstetrics and Gynecology

## 2021-11-21 LAB — SURGICAL PATHOLOGY

## 2022-03-24 IMAGING — MG MM DIGITAL SCREENING BILAT W/ TOMO AND CAD
8 series · 8 of 24 positions shown · non-contrast
Comparison: Previous exam(s).

CLINICAL DATA: Screening.

EXAM:
DIGITAL SCREENING BILATERAL MAMMOGRAM WITH TOMOSYNTHESIS AND CAD
TECHNIQUE: Bilateral screening digital craniocaudal and mediolateral oblique
mammograms were obtained. Bilateral screening digital breast
tomosynthesis was performed. The images were evaluated with
computer-aided detection.

[R CC synth-2D]
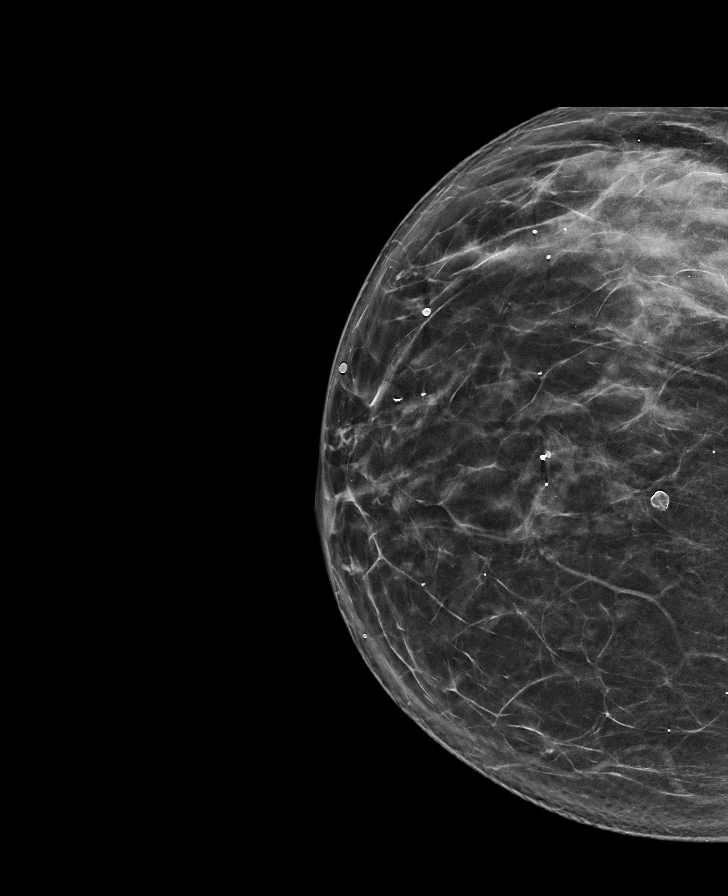

[L CC synth-2D]
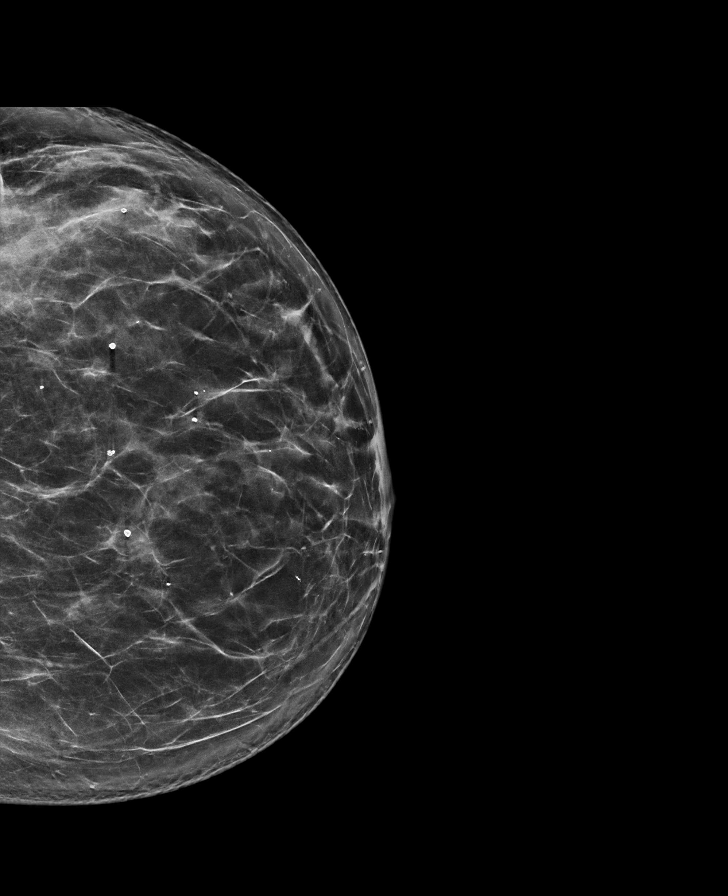

[R MLO synth-2D]
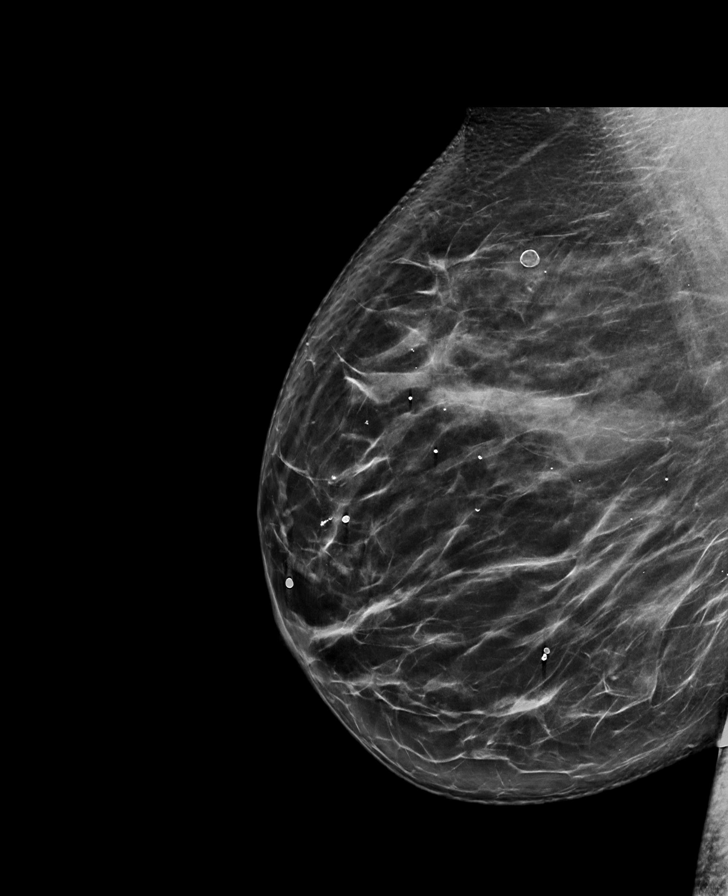

[L MLO synth-2D]
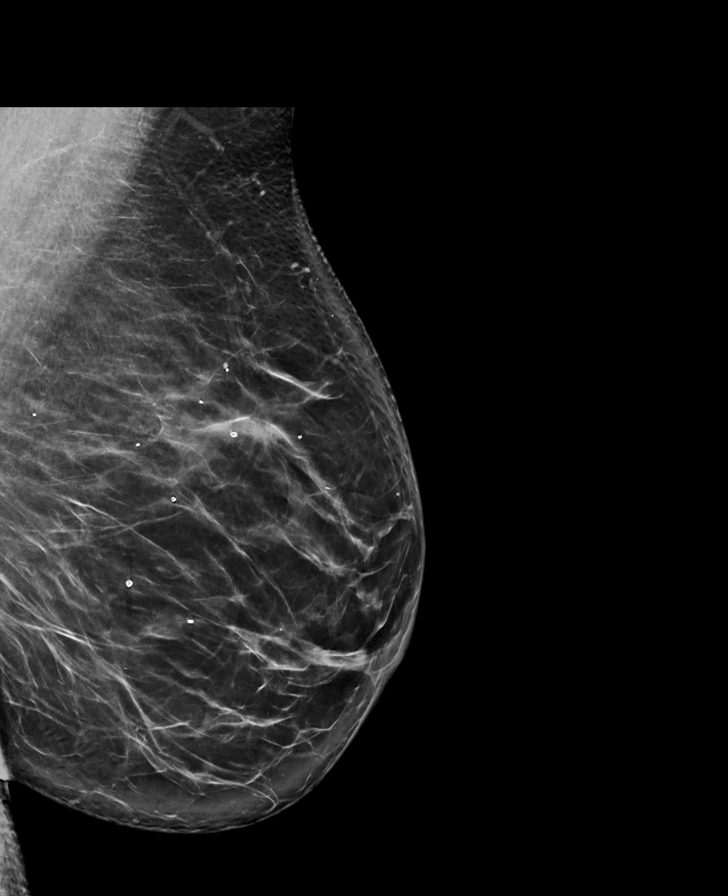

[R CC tomo · tomo slice 39/77.0]
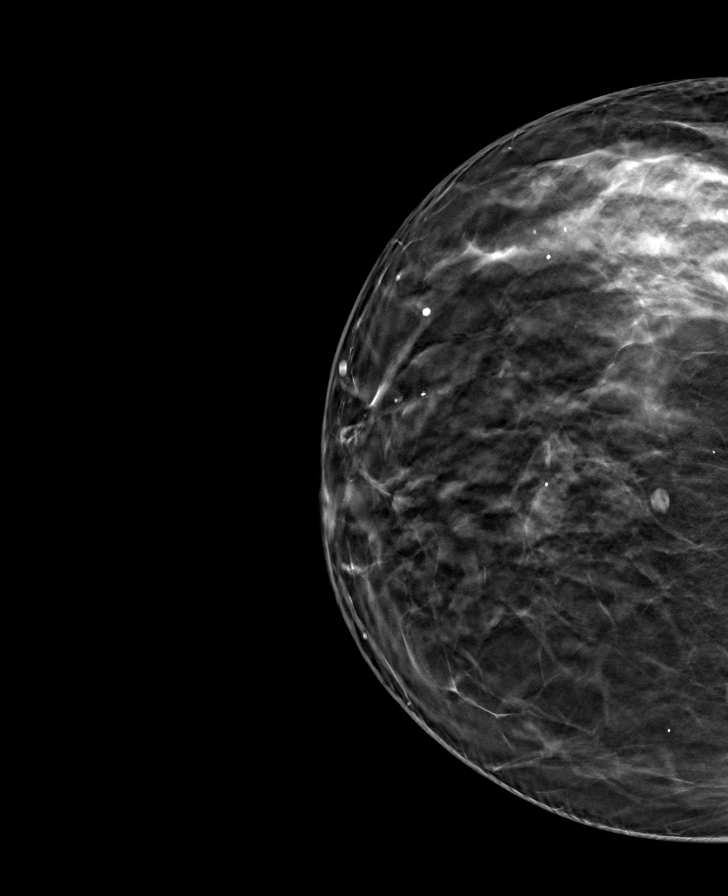

[L CC tomo · tomo slice 41/81.0]
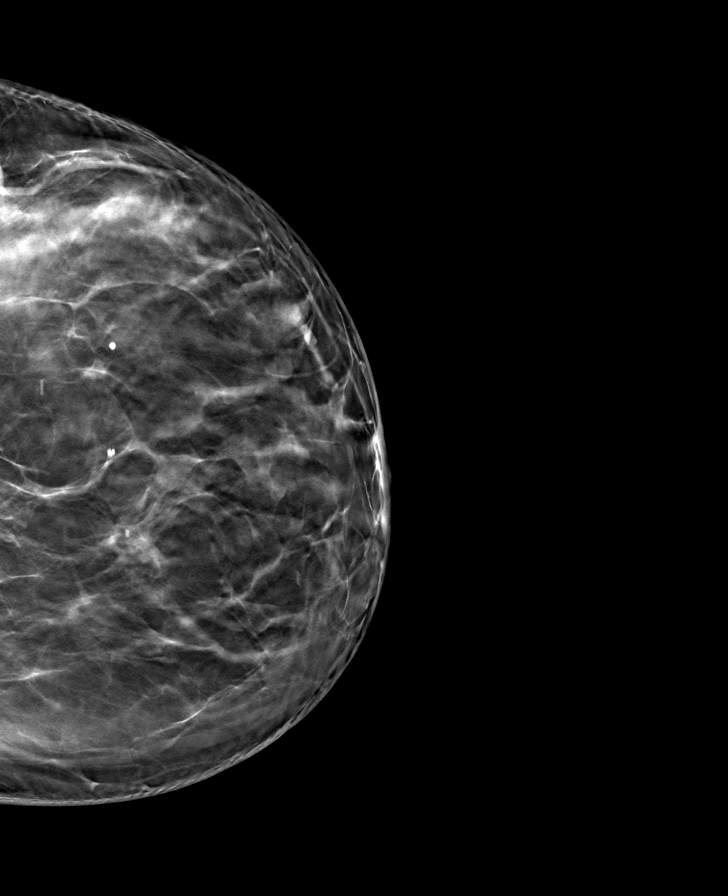

[L MLO tomo · tomo slice 49/98.0]
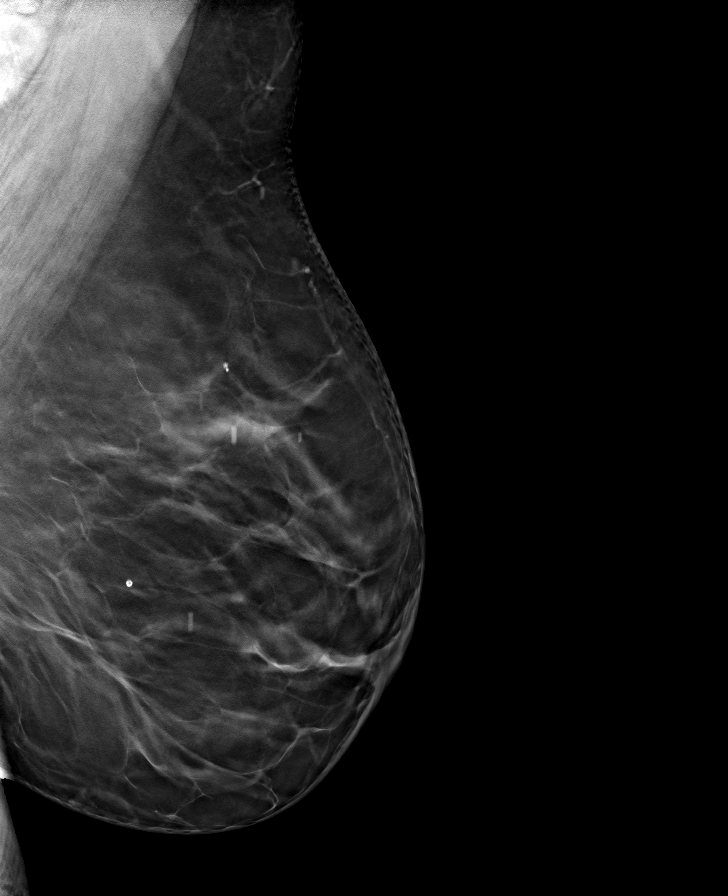

[R MLO tomo · tomo slice 47/94.0]
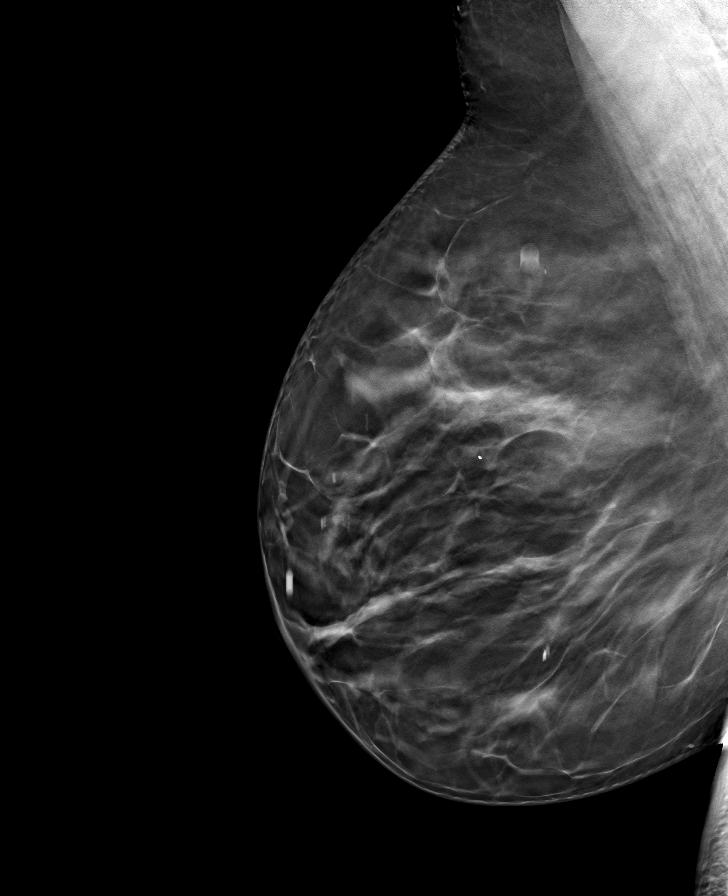

[8 of 24 positions shown; findings below may reference images not displayed]

ACR Breast Density Category b: There are scattered areas of
fibroglandular density.
FINDINGS: There are no findings suspicious for malignancy.
IMPRESSION: No mammographic evidence of malignancy. A result letter of this
screening mammogram will be mailed directly to the patient.

RECOMMENDATION:
Screening mammogram in one year. (Code:51-O-LD2)

BI-RADS CATEGORY  1: Negative.

## 2022-04-29 ENCOUNTER — Other Ambulatory Visit: Payer: Self-pay | Admitting: Family Medicine

## 2022-04-29 DIAGNOSIS — Z1231 Encounter for screening mammogram for malignant neoplasm of breast: Secondary | ICD-10-CM

## 2022-06-12 ENCOUNTER — Ambulatory Visit
Admission: RE | Admit: 2022-06-12 | Discharge: 2022-06-12 | Disposition: A | Discharge status: Home / Self Care | Payer: BC Managed Care – PPO | Source: Ambulatory Visit | Attending: Family Medicine | Admitting: Family Medicine

## 2022-06-12 DIAGNOSIS — Z1231 Encounter for screening mammogram for malignant neoplasm of breast: Secondary | ICD-10-CM

## 2022-06-16 ENCOUNTER — Other Ambulatory Visit: Payer: Self-pay | Admitting: Family Medicine

## 2022-06-16 DIAGNOSIS — R928 Other abnormal and inconclusive findings on diagnostic imaging of breast: Secondary | ICD-10-CM

## 2022-06-25 ENCOUNTER — Ambulatory Visit
Admission: RE | Admit: 2022-06-25 | Discharge: 2022-06-25 | Disposition: A | Discharge status: Home / Self Care | Payer: BC Managed Care – PPO | Source: Ambulatory Visit | Attending: Family Medicine | Admitting: Family Medicine

## 2022-06-25 DIAGNOSIS — R928 Other abnormal and inconclusive findings on diagnostic imaging of breast: Secondary | ICD-10-CM

## 2022-10-10 ENCOUNTER — Other Ambulatory Visit (HOSPITAL_COMMUNITY)
Admission: RE | Admit: 2022-10-10 | Discharge: 2022-10-10 | Disposition: A | Discharge status: Home / Self Care | Payer: BC Managed Care – PPO | Source: Ambulatory Visit | Attending: Obstetrics and Gynecology | Admitting: Obstetrics and Gynecology

## 2022-10-10 DIAGNOSIS — Z01419 Encounter for gynecological examination (general) (routine) without abnormal findings: Secondary | ICD-10-CM | POA: Insufficient documentation

## 2022-10-13 LAB — CYTOLOGY - PAP
Comment: NEGATIVE
Diagnosis: NEGATIVE
High risk HPV: NEGATIVE

## 2023-05-04 ENCOUNTER — Other Ambulatory Visit: Payer: Self-pay | Admitting: Family Medicine

## 2023-05-04 DIAGNOSIS — Z1231 Encounter for screening mammogram for malignant neoplasm of breast: Secondary | ICD-10-CM

## 2023-06-15 ENCOUNTER — Ambulatory Visit
Admission: RE | Admit: 2023-06-15 | Discharge: 2023-06-15 | Disposition: A | Discharge status: Home / Self Care | Payer: BC Managed Care – PPO | Source: Ambulatory Visit | Attending: Family Medicine | Admitting: Family Medicine

## 2023-06-15 DIAGNOSIS — Z1231 Encounter for screening mammogram for malignant neoplasm of breast: Secondary | ICD-10-CM

## 2024-06-20 ENCOUNTER — Other Ambulatory Visit: Payer: Self-pay | Admitting: Family Medicine

## 2024-06-20 DIAGNOSIS — Z1231 Encounter for screening mammogram for malignant neoplasm of breast: Secondary | ICD-10-CM

## 2024-06-24 ENCOUNTER — Other Ambulatory Visit: Payer: Self-pay | Admitting: Medical Genetics

## 2024-07-01 ENCOUNTER — Ambulatory Visit
Admission: RE | Admit: 2024-07-01 | Discharge: 2024-07-01 | Disposition: A | Discharge status: Home / Self Care | Payer: Self-pay | Source: Ambulatory Visit | Attending: Family Medicine | Admitting: Family Medicine

## 2024-07-01 DIAGNOSIS — Z1231 Encounter for screening mammogram for malignant neoplasm of breast: Secondary | ICD-10-CM

## 2024-07-08 ENCOUNTER — Other Ambulatory Visit (HOSPITAL_COMMUNITY): Payer: Self-pay

## 2024-08-29 ENCOUNTER — Other Ambulatory Visit (HOSPITAL_COMMUNITY)
Admission: RE | Admit: 2024-08-29 | Discharge: 2024-08-29 | Disposition: A | Discharge status: Home / Self Care | Payer: Self-pay | Source: Ambulatory Visit | Attending: Medical Genetics | Admitting: Medical Genetics

## 2024-09-05 LAB — GENECONNECT MOLECULAR SCREEN: Genetic Analysis Overall Interpretation: NEGATIVE
